# Patient Record
Sex: Male | Born: 1955 | Race: White | Hispanic: No | Smoking: Never smoker
Health system: Southern US, Community
[De-identification: ages and names within clinical notes are randomized; demographics above are authoritative.]

---

## 2015-11-02 ENCOUNTER — Ambulatory Visit (INDEPENDENT_AMBULATORY_CARE_PROVIDER_SITE_OTHER): Payer: PRIVATE HEALTH INSURANCE | Admitting: Sports Medicine

## 2015-11-02 VITALS — BP 141/74 | HR 61 | Resp 16 | Wt 223.7 lb

## 2015-11-02 DIAGNOSIS — M170 Bilateral primary osteoarthritis of knee: Secondary | ICD-10-CM

## 2015-11-02 DIAGNOSIS — Z96653 Presence of artificial knee joint, bilateral: Secondary | ICD-10-CM | POA: Insufficient documentation

## 2015-11-02 MED ORDER — ACETAMINOPHEN ER 650 MG PO TBCR: 1300.0000 mg | EXTENDED_RELEASE_TABLET | Freq: Three times a day (TID) | ORAL | Status: DC | PRN

## 2015-11-02 MED ORDER — TRAMADOL HCL 50 MG PO TABS: ORAL_TABLET | ORAL | Status: DC

## 2015-11-02 MED ORDER — MELOXICAM 15 MG PO TABS: ORAL_TABLET | ORAL | Status: DC

## 2015-11-02 NOTE — Progress Notes (Signed)
   Subjective:    I'm seeing this patient as a consultation for:  Dr. Johnney Killian  CC:  Bilateral knee pain  HPI: This is a pleasant 60 year old male with known bilateral knee osteoarthritis, x-ray confirmed, he has been seeing an orthopedic surgeon, most recent steroid injection was one month ago, takes ibuprofen and occasional arthritis strength Tylenol. Unfortunately continues to have pain bilaterally at the medial joint line, left worse than right. He has been looking into knee replacements in [facility], and needs MRIs prior, he is also agreeable to proceed with other nonoperative measures. No mechanical symptoms, he is post-knee arthroscopy on the right.  Past medical history, Surgical history, Family history not pertinant except as noted below, Social history, Allergies, and medications have been entered into the medical record, reviewed, and no changes needed.   Review of Systems: No headache, visual changes, nausea, vomiting, diarrhea, constipation, dizziness, abdominal pain, skin rash, fevers, chills, night sweats, weight loss, swollen lymph nodes, body aches, joint swelling, muscle aches, chest pain, shortness of breath, mood changes, visual or auditory hallucinations.   Objective:   General: Well Developed, well nourished, and in no acute distress.  Neuro/Psych: Alert and oriented x3, extra-ocular muscles intact, able to move all 4 extremities, sensation grossly intact. Skin: Warm and dry, no rashes noted.  Respiratory: Not using accessory muscles, speaking in full sentences, trachea midline.  Cardiovascular: Pulses palpable, no extremity edema. Abdomen: Does not appear distended. Bilateral knees: Minimal palpable effusion with a fluid wave and tenderness at the medial joint line bilaterally. ROM normal in flexion and extension and lower leg rotation. Ligaments with solid consistent endpoints including ACL, PCL, LCL, MCL. Negative Mcmurray's and provocative meniscal  tests. Non painful patellar compression. Patellar and quadriceps tendons unremarkable. Hamstring and quadriceps strength is normal.  Impression and Recommendations:   This case required medical decision making of moderate complexity.

## 2015-11-02 NOTE — Assessment & Plan Note (Signed)
Switching to meloxicam, continue arthritis strength Tylenol, adding tramadol for breakthrough pain. Return for custom orthotics. We will get Orthovisc approved, he just had a steroid injection one month ago. He is also getting set up for knee arthroplasties, needs bilateral MRIs, has already had x-rays.

## 2015-11-04 ENCOUNTER — Telehealth: Payer: Self-pay | Admitting: Sports Medicine

## 2015-11-04 ENCOUNTER — Ambulatory Visit (INDEPENDENT_AMBULATORY_CARE_PROVIDER_SITE_OTHER): Payer: PRIVATE HEALTH INSURANCE | Admitting: Sports Medicine

## 2015-11-04 VITALS — BP 114/65 | HR 71 | Resp 16 | Wt 223.0 lb

## 2015-11-04 DIAGNOSIS — M170 Bilateral primary osteoarthritis of knee: Secondary | ICD-10-CM

## 2015-11-04 NOTE — Telephone Encounter (Signed)
-----   Message from Monica Becton, MD sent at 11/02/2015 11:25 AM EST ----- Bilateral x-ray confirmed knee osteoarthritis, failed steroid injections, NSAIDs, needs bilateral Orthovisc approval. ___________________________________________ Ihor Austin. Benjamin Stain, M.D., ABFM., CAQSM. Primary Care and Sports Medicine [facility]  Adjunct Instructor of Family Medicine  [employer]

## 2015-11-04 NOTE — Progress Notes (Signed)

## 2015-11-04 NOTE — Assessment & Plan Note (Signed)
Doing well with meloxicam, tramadol, custom orthotics as above.

## 2015-11-04 NOTE — Telephone Encounter (Signed)
Submitted for approval on Orthovisc. Awaiting confirmation.  

## 2015-11-08 ENCOUNTER — Ambulatory Visit (INDEPENDENT_AMBULATORY_CARE_PROVIDER_SITE_OTHER): Payer: PRIVATE HEALTH INSURANCE

## 2015-11-08 DIAGNOSIS — S83241A Other tear of medial meniscus, current injury, right knee, initial encounter: Secondary | ICD-10-CM

## 2015-11-08 DIAGNOSIS — M170 Bilateral primary osteoarthritis of knee: Secondary | ICD-10-CM

## 2015-11-08 DIAGNOSIS — X58XXXA Exposure to other specified factors, initial encounter: Secondary | ICD-10-CM | POA: Diagnosis not present

## 2015-11-08 NOTE — Telephone Encounter (Signed)
Received the following information from [employer] benefits investigation:  PATIENT HAS PPO PLAN WITH AN EFFECTIVE DATE OF 04/08/2013. E5631 IS COVERED AT 60% & [id number] IS COVERED AT 100% OF THE CONTRACTED RATE WHEN PERFORMED IN AN OFFICE SETTING. *DEDUCTIBLE MUST BE MET FOR COVERAGE TO APPLY. IF OUT OF POCKET IS MET, COVERAGE GOES TO 100%. A COPAY OF $75.00 APPLIES WHETHER OR NOT A SPECIALIST OFFICE VISIT IS BILLED. REF# [id number].  Pt has been advised of the estimated OOP cost. He is going to think about it and then let us know.

## 2015-11-08 NOTE — Telephone Encounter (Signed)
Thanks, keep me posted.

## 2015-11-10 ENCOUNTER — Ambulatory Visit (INDEPENDENT_AMBULATORY_CARE_PROVIDER_SITE_OTHER): Payer: PRIVATE HEALTH INSURANCE | Admitting: Sports Medicine

## 2015-11-10 VITALS — BP 153/86 | HR 83 | Resp 18 | Wt 223.0 lb

## 2015-11-10 DIAGNOSIS — M170 Bilateral primary osteoarthritis of knee: Secondary | ICD-10-CM | POA: Diagnosis not present

## 2015-11-10 NOTE — Progress Notes (Signed)
  Subjective:    CC: follow-up  HPI: Knee arthritis: Known bilateral knee osteoarthritis, patient is here for follow-up of his MRI. MRI simply showed arthritis in all 3 compartments of both knees with medial meniscal tearing as expected. He continues to do well with Tylenol, meloxicam, and tramadol and is happy to call in for refills.  Past medical history, Surgical history, Family history not pertinant except as noted below, Social history, Allergies, and medications have been entered into the medical record, reviewed, and no changes needed.   Review of Systems: No fevers, chills, night sweats, weight loss, chest pain, or shortness of breath.   Objective:    General: Well Developed, well nourished, and in no acute distress.  Neuro: Alert and oriented x3, extra-ocular muscles intact, sensation grossly intact.  HEENT: Normocephalic, atraumatic, pupils equal round reactive to light, neck supple, no masses, no lymphadenopathy, thyroid nonpalpable.  Skin: Warm and dry, no rashes. Cardiac: Regular rate and rhythm, no murmurs rubs or gallops, no lower extremity edema.  Respiratory: Clear to auscultation bilaterally. Not using accessory muscles, speaking in full sentences.  Went over the MRI findings with the patient.  Impression and Recommendations:

## 2015-11-10 NOTE — Assessment & Plan Note (Signed)
Doing well with meloxicam, Tylenol, tramadol. MRIs per patient request showed triompartmental DJD with medial meniscal tearing as expected. Will call for refills. Last injection was about a month ago by another orthopedist. Return to see me on an as-needed basis, he was approved for Orthovisc but is responsible for his deductible first. He can return in 2 months if needed for a repeat steroid injection.

## 2015-12-08 ENCOUNTER — Other Ambulatory Visit: Payer: Self-pay | Admitting: Sports Medicine

## 2015-12-09 ENCOUNTER — Ambulatory Visit: Payer: PRIVATE HEALTH INSURANCE | Admitting: Sports Medicine

## 2015-12-23 ENCOUNTER — Ambulatory Visit (INDEPENDENT_AMBULATORY_CARE_PROVIDER_SITE_OTHER): Payer: PRIVATE HEALTH INSURANCE | Admitting: Sports Medicine

## 2015-12-23 VITALS — BP 158/82 | HR 60 | Wt 217.9 lb

## 2015-12-23 DIAGNOSIS — M170 Bilateral primary osteoarthritis of knee: Secondary | ICD-10-CM

## 2015-12-23 NOTE — Progress Notes (Signed)
  Subjective:    CC: bilateral knee pain  HPI: This is a pleasant 60 year old male, he has known primary osteoarthritis of both knees and desires repeat interventional treatment today, this will be the first injection we have done, pain is moderate, persistent, localized at the medial joint line of both knees without radiation.  Past medical history, Surgical history, Family history not pertinant except as noted below, Social history, Allergies, and medications have been entered into the medical record, reviewed, and no changes needed.   Review of Systems: No fevers, chills, night sweats, weight loss, chest pain, or shortness of breath.   Objective:    General: Well Developed, well nourished, and in no acute distress.  Neuro: Alert and oriented x3, extra-ocular muscles intact, sensation grossly intact.  HEENT: Normocephalic, atraumatic, pupils equal round reactive to light, neck supple, no masses, no lymphadenopathy, thyroid nonpalpable.  Skin: Warm and dry, no rashes. Cardiac: Regular rate and rhythm, no murmurs rubs or gallops, no lower extremity edema.  Respiratory: Clear to auscultation bilaterally. Not using accessory muscles, speaking in full sentences. Knees: Tender at the medial joint line bilaterally with an effusion in the right knee. ROM normal in flexion and extension and lower leg rotation. Ligaments with solid consistent endpoints including ACL, PCL, LCL, MCL. Negative Mcmurray's and provocative meniscal tests. Non painful patellar compression. Patellar and quadriceps tendons unremarkable. Hamstring and quadriceps strength is normal.  Procedure: Real-time Ultrasound Guided aspiration/Injection of right knee Device: GE Logiq E  Verbal informed consent obtained.  Time-out conducted.  Noted no overlying erythema, induration, or other signs of local infection.  Skin prepped in a sterile fashion.  Local anesthesia: Topical Ethyl chloride.  With sterile technique and under  real time ultrasound guidance:  30 mL straw-colored fluid aspirated, syringe switched and 1 mL kenalog 40, 2 mL lidocaine, 2 mL Marcaine injected easily Completed without difficulty  Pain immediately resolved suggesting accurate placement of the medication.  Advised to call if fevers/chills, erythema, induration, drainage, or persistent bleeding.  Images permanently stored and available for review in the ultrasound unit.  Impression: Technically successful ultrasound guided injection.  Procedure: Real-time Ultrasound Guided Injection of left knee Device: GE Logiq E  Verbal informed consent obtained.  Time-out conducted.  Noted no overlying erythema, induration, or other signs of local infection.  Skin prepped in a sterile fashion.  Local anesthesia: Topical Ethyl chloride.  With sterile technique and under real time ultrasound guidance:  1 mL kenalog 40, 2 mL lidocaine, 2 mL Marcaine injected easily Completed without difficulty  Pain immediately resolved suggesting accurate placement of the medication.  Advised to call if fevers/chills, erythema, induration, drainage, or persistent bleeding.  Images permanently stored and available for review in the ultrasound unit.  Impression: Technically successful ultrasound guided injection.  Impression and Recommendations:

## 2015-12-23 NOTE — Assessment & Plan Note (Signed)
Bilateral knee injection as above, return as needed. 

## 2015-12-23 NOTE — Addendum Note (Signed)
Addended by: Baird KayUGLAS, Yaelis Scharfenberg M on: 12/23/2015 04:10 PM   Modules accepted: Medications

## 2016-01-14 ENCOUNTER — Other Ambulatory Visit: Payer: Self-pay | Admitting: Sports Medicine

## 2016-02-08 ENCOUNTER — Ambulatory Visit (INDEPENDENT_AMBULATORY_CARE_PROVIDER_SITE_OTHER): Payer: PRIVATE HEALTH INSURANCE | Admitting: Sports Medicine

## 2016-02-08 DIAGNOSIS — M170 Bilateral primary osteoarthritis of knee: Secondary | ICD-10-CM

## 2016-02-08 MED ORDER — TRAMADOL HCL 50 MG PO TABS: ORAL_TABLET | ORAL | Status: DC

## 2016-02-08 MED ORDER — CELECOXIB 200 MG PO CAPS: ORAL_CAPSULE | ORAL | Status: DC

## 2016-02-08 NOTE — Progress Notes (Signed)
  Subjective:    CC: Knee swelling  HPI: This is a pleasant 60 year old male with known knee osteoarthritis, his last injection was only 6 weeks ago, he is having recurrence of pain and swelling, right worse than left, moderate, persistent, pain is worst at the medial joint lines, desires repeat interventional treatment for arthrocentesis today. No constitutional symptoms, no trauma, he has been walking more than usual. 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  Past medical history, Surgical history, Family history not pertinant except as noted below, Social history, Allergies, and medications have been entered into the medical record, reviewed, and no changes needed.   Review of Systems: No fevers, chills, night sweats, weight loss, chest pain, or shortness of breath.   Objective:    General: Well Developed, well nourished, and in no acute distress.  Neuro: Alert and oriented x3, extra-ocular muscles intact, sensation grossly intact.  HEENT: Normocephalic, atraumatic, pupils equal round reactive to light, neck supple, no masses, no lymphadenopathy, thyroid nonpalpable.  Skin: Warm and dry, no rashes. Cardiac: Regular rate and rhythm, no murmurs rubs or gallops, no lower extremity edema.  Respiratory: Clear to auscultation bilaterally. Not using accessory muscles, speaking in full sentences. Bilateral Knee: Visibly swollen with a palpable effusion and medial joint line tenderness. ROM normal in flexion and extension and lower leg rotation. Ligaments with solid consistent endpoints including ACL, PCL, LCL, MCL. Negative Mcmurray's and provocative meniscal tests. Non painful patellar compression. Patellar and quadriceps tendons unremarkable. Hamstring and quadriceps strength is normal.  Procedure: Real-time Ultrasound Guided aspiration of left knee  Device: GE Logiq E  Verbal informed consent obtained.  Time-out conducted.  Noted no overlying erythema, induration, or  other signs of local infection.  Skin prepped in a sterile fashion.  Local anesthesia: Topical Ethyl chloride.  With sterile technique and under real time ultrasound guidance:  10 mL aspirated from the suprapatellar recess Completed without difficulty  Pain immediately resolved suggesting accurate placement of the medication.  Advised to call if fevers/chills, erythema, induration, drainage, or persistent bleeding.  Images permanently stored and available for review in the ultrasound unit.  Impression: Technically successful ultrasound guided injection.  Procedure: Real-time Ultrasound Guided aspiration of right knee Device: GE Logiq E  Verbal informed consent obtained.  Time-out conducted.  Noted no overlying erythema, induration, or other signs of local infection.  Skin prepped in a sterile fashion.  Local anesthesia: Topical Ethyl chloride.  With sterile technique and under real time ultrasound guidance:  30 mL of straw-colored fluid aspirated from the suprapatellar recess. Completed without difficulty  Pain immediately resolved suggesting accurate placement of the medication.  Advised to call if fevers/chills, erythema, induration, drainage, or persistent bleeding.  Images permanently stored and available for review in the ultrasound unit.  Impression: Technically successful ultrasound guided injection.  Impression and Recommendations:

## 2016-02-08 NOTE — Assessment & Plan Note (Signed)
Switching from meloxicam to Celebrex and increasing tramadol to 6 pills per day.  aspiration of both knees today.  previous injection was only 6 weeks ago.  declines Visco

## 2016-03-03 ENCOUNTER — Other Ambulatory Visit: Payer: Self-pay | Admitting: Sports Medicine

## 2016-03-27 ENCOUNTER — Ambulatory Visit (INDEPENDENT_AMBULATORY_CARE_PROVIDER_SITE_OTHER): Payer: PRIVATE HEALTH INSURANCE | Admitting: Sports Medicine

## 2016-03-27 VITALS — BP 87/61 | HR 79 | Wt 200.0 lb

## 2016-03-27 DIAGNOSIS — M170 Bilateral primary osteoarthritis of knee: Secondary | ICD-10-CM

## 2016-03-27 MED ORDER — TRAMADOL HCL 50 MG PO TABS: ORAL_TABLET | ORAL | Status: DC

## 2016-03-27 NOTE — Progress Notes (Signed)
  Subjective:    CC: Follow-up  HPI: Bilateral knee osteoarthritis: 3 months since last injection, desires right knee aspiration and repeat bilateral injection, pain is moderate, persistent, localized at the joint lines without radiation.  Past medical history, Surgical history, Family history not pertinant except as noted below, Social history, Allergies, and medications have been entered into the medical record, reviewed, and no changes needed.   Review of Systems: No fevers, chills, night sweats, weight loss, chest pain, or shortness of breath.   Objective:    General: Well Developed, well nourished, and in no acute distress.  Neuro: Alert and oriented x3, extra-ocular muscles intact, sensation grossly intact.  HEENT: Normocephalic, atraumatic, pupils equal round reactive to light, neck supple, no masses, no lymphadenopathy, thyroid nonpalpable.  Skin: Warm and dry, no rashes. Cardiac: Regular rate and rhythm, no murmurs rubs or gallops, no lower extremity edema.  Respiratory: Clear to auscultation bilaterally. Not using accessory muscles, speaking in full sentences.  Procedure: Real-time Ultrasound Guided Injection of right knee Device: GE Logiq E  Verbal informed consent obtained.  Time-out conducted.  Noted no overlying erythema, induration, or other signs of local infection.  Skin prepped in a sterile fashion.  Local anesthesia: Topical Ethyl chloride.  With sterile technique and under real time ultrasound guidance:  Aspirated 18 mL straw-colored fluid, syringe switched and 1 mL Kenalog 40, 2 mL lidocaine, 2 mL Marcaine injected easily. Completed without difficulty  Pain immediately resolved suggesting accurate placement of the medication.  Advised to call if fevers/chills, erythema, induration, drainage, or persistent bleeding.  Images permanently stored and available for review in the ultrasound unit.  Impression: Technically successful ultrasound guided  injection.  Procedure: Real-time Ultrasound Guided Injection of left knee Device: GE Logiq E  Verbal informed consent obtained.  Time-out conducted.  Noted no overlying erythema, induration, or other signs of local infection.  Skin prepped in a sterile fashion.  Local anesthesia: Topical Ethyl chloride.  With sterile technique and under real time ultrasound guidance:  Using a 25-gauge needle advanced into the suprapatellar recess, 1 mL Kenalog 40, 2 mL lidocaine, 2 mL Marcaine injected easily. Completed without difficulty  Pain immediately resolved suggesting accurate placement of the medication.  Advised to call if fevers/chills, erythema, induration, drainage, or persistent bleeding.  Images permanently stored and available for review in the ultrasound unit.  Impression: Technically successful ultrasound guided injection.  Impression and Recommendations:

## 2016-03-27 NOTE — Assessment & Plan Note (Signed)
3 months since previous procedure. Aspiration of right knee with injection of both knees. Declined viscosupplementation at the last visit.

## 2016-03-28 ENCOUNTER — Ambulatory Visit: Payer: PRIVATE HEALTH INSURANCE | Admitting: Sports Medicine

## 2016-06-30 ENCOUNTER — Ambulatory Visit (INDEPENDENT_AMBULATORY_CARE_PROVIDER_SITE_OTHER): Payer: PRIVATE HEALTH INSURANCE | Admitting: Sports Medicine

## 2016-06-30 DIAGNOSIS — M170 Bilateral primary osteoarthritis of knee: Secondary | ICD-10-CM | POA: Diagnosis not present

## 2016-06-30 MED ORDER — TRAMADOL HCL ER 200 MG PO TB24: 200.0000 mg | ORAL_TABLET | Freq: Every day | ORAL | 0 refills | Status: DC

## 2016-06-30 NOTE — Assessment & Plan Note (Signed)
Previous injection was 3 months, repeat bilateral injection as above. Return as needed.

## 2016-06-30 NOTE — Progress Notes (Signed)
  Subjective:    CC: Follow-up  HPI: Bilateral knee osteoarthritis: Pain is moderate, persistent, previous injection was 3 months ago, desires repeat interventional treatment, pain is localized at the medial joint line bilaterally with swelling on the right side, no radiation. No trauma, no mechanical symptoms.  Past medical history:  Negative.  See flowsheet/record as well for more information.  Surgical history: Negative.  See flowsheet/record as well for more information.  Family history: Negative.  See flowsheet/record as well for more information.  Social history: Negative.  See flowsheet/record as well for more information.  Allergies, and medications have been entered into the medical record, reviewed, and no changes needed.   Review of Systems: No fevers, chills, night sweats, weight loss, chest pain, or shortness of breath.   Objective:    General: Well Developed, well nourished, and in no acute distress.  Neuro: Alert and oriented x3, extra-ocular muscles intact, sensation grossly intact.  HEENT: Normocephalic, atraumatic, pupils equal round reactive to light, neck supple, no masses, no lymphadenopathy, thyroid nonpalpable.  Skin: Warm and dry, no rashes. Cardiac: Regular rate and rhythm, no murmurs rubs or gallops, no lower extremity edema.  Respiratory: Clear to auscultation bilaterally. Not using accessory muscles, speaking in full sentences. Bilateral knees: Tender to palpation at the medial joint line bilaterally with a palpable fluid wave and effusion on the right side. ROM normal in flexion and extension and lower leg rotation. Ligaments with solid consistent endpoints including ACL, PCL, LCL, MCL. Negative Mcmurray's and provocative meniscal tests. Non painful patellar compression. Patellar and quadriceps tendons unremarkable. Hamstring and quadriceps strength is normal.  Procedure: Real-time Ultrasound Guided aspiration/injection of right knee Device: GE Logiq E    Verbal informed consent obtained.  Time-out conducted.  Noted no overlying erythema, induration, or other signs of local infection.  Skin prepped in a sterile fashion.  Local anesthesia: Topical Ethyl chloride.  With sterile technique and under real time ultrasound guidance:  Aspirated 28 mL straw-colored fluid, syringe switched and 1 mL kenalog 40, 2 mL lidocaine, 2 mL Marcaine injected easily Completed without difficulty  Pain immediately resolved suggesting accurate placement of the medication.  Advised to call if fevers/chills, erythema, induration, drainage, or persistent bleeding.  Images permanently stored and available for review in the ultrasound unit.  Impression: Technically successful ultrasound guided injection.  Procedure: Real-time Ultrasound Guided Injection of left knee Device: GE Logiq E  Verbal informed consent obtained.  Time-out conducted.  Noted no overlying erythema, induration, or other signs of local infection.  Skin prepped in a sterile fashion.  Local anesthesia: Topical Ethyl chloride.  With sterile technique and under real time ultrasound guidance:  1 mL kenalog 40, 2 mL lidocaine, 2 mL Marcaine injected easily. Completed without difficulty  Pain immediately resolved suggesting accurate placement of the medication.  Advised to call if fevers/chills, erythema, induration, drainage, or persistent bleeding.  Images permanently stored and available for review in the ultrasound unit.  Impression: Technically successful ultrasound guided injection.  Impression and Recommendations:    Primary osteoarthritis of both knees Previous injection was 3 months, repeat bilateral injection as above. Return as needed.  I spent 25 minutes with this patient, greater than 50% was face-to-face time counseling regarding the above diagnoses, this was separate from the time spent performing the procedure.

## 2016-08-01 ENCOUNTER — Other Ambulatory Visit: Payer: Self-pay | Admitting: Sports Medicine

## 2016-08-01 DIAGNOSIS — M170 Bilateral primary osteoarthritis of knee: Secondary | ICD-10-CM

## 2016-08-21 ENCOUNTER — Other Ambulatory Visit: Payer: Self-pay | Admitting: Sports Medicine

## 2016-08-21 DIAGNOSIS — M170 Bilateral primary osteoarthritis of knee: Secondary | ICD-10-CM

## 2016-09-02 ENCOUNTER — Other Ambulatory Visit: Payer: Self-pay | Admitting: Sports Medicine

## 2016-09-02 DIAGNOSIS — M170 Bilateral primary osteoarthritis of knee: Secondary | ICD-10-CM

## 2016-10-03 ENCOUNTER — Ambulatory Visit (INDEPENDENT_AMBULATORY_CARE_PROVIDER_SITE_OTHER): Payer: PRIVATE HEALTH INSURANCE | Admitting: Sports Medicine

## 2016-10-03 DIAGNOSIS — M170 Bilateral primary osteoarthritis of knee: Secondary | ICD-10-CM | POA: Diagnosis not present

## 2016-10-03 MED ORDER — TRAMADOL HCL ER 300 MG PO TB24: 300.0000 mg | ORAL_TABLET | Freq: Every day | ORAL | 0 refills | Status: DC

## 2016-10-03 MED ORDER — TRAMADOL HCL 50 MG PO TABS: ORAL_TABLET | ORAL | 1 refills | Status: DC

## 2016-10-03 NOTE — Progress Notes (Signed)
  Subjective:    CC: Bilateral knee pain  HPI: This is a pleasant 60 year old male with known bilateral knee osteoarthritis, we injected him 3 months ago and he did well, now having recurrence of pain, desires repeat interventional treatment today, pain is moderate, persistent, localized at the joint lines on both sides with mild swelling. No mechanical symptoms, no trauma, no constitutional symptoms.  Past medical history:  Negative.  See flowsheet/record as well for more information.  Surgical history: Negative.  See flowsheet/record as well for more information.  Family history: Negative.  See flowsheet/record as well for more information.  Social history: Negative.  See flowsheet/record as well for more information.  Allergies, and medications have been entered into the medical record, reviewed, and no changes needed.   Review of Systems: No fevers, chills, night sweats, weight loss, chest pain, or shortness of breath.   Objective:    General: Well Developed, well nourished, and in no acute distress.  Neuro: Alert and oriented x3, extra-ocular muscles intact, sensation grossly intact.  HEENT: Normocephalic, atraumatic, pupils equal round reactive to light, neck supple, no masses, no lymphadenopathy, thyroid nonpalpable.  Skin: Warm and dry, no rashes. Cardiac: Regular rate and rhythm, no murmurs rubs or gallops, no lower extremity edema.  Respiratory: Clear to auscultation bilaterally. Not using accessory muscles, speaking in full sentences. Bilateral knees: Only a mild effusion with tenderness at the medial joint lines laterally. ROM normal in flexion and extension and lower leg rotation. Ligaments with solid consistent endpoints including ACL, PCL, LCL, MCL. Negative Mcmurray's and provocative meniscal tests. Non painful patellar compression. Patellar and quadriceps tendons unremarkable. Hamstring and quadriceps strength is normal.  Procedure: Real-time Ultrasound Guided  Injection of left knee Device: GE Logiq E  Verbal informed consent obtained.  Time-out conducted.  Noted no overlying erythema, induration, or other signs of local infection.  Skin prepped in a sterile fashion.  Local anesthesia: Topical Ethyl chloride.  With sterile technique and under real time ultrasound guidance:  1 mL kenalog 40, 2 mL lidocaine, 2 mL Marcaine injected easily. Completed without difficulty  Pain immediately resolved suggesting accurate placement of the medication.  Advised to call if fevers/chills, erythema, induration, drainage, or persistent bleeding.  Images permanently stored and available for review in the ultrasound unit.  Impression: Technically successful ultrasound guided injection.  Procedure: Real-time Ultrasound Guided Injection of right knee Device: GE Logiq E  Verbal informed consent obtained.  Time-out conducted.  Noted no overlying erythema, induration, or other signs of local infection.  Skin prepped in a sterile fashion.  Local anesthesia: Topical Ethyl chloride.  With sterile technique and under real time ultrasound guidance:  1 mL kenalog 40, 2 mL lidocaine, 2 mL Marcaine injected easily. Completed without difficulty  Pain immediately resolved suggesting accurate placement of the medication.  Advised to call if fevers/chills, erythema, induration, drainage, or persistent bleeding.  Images permanently stored and available for review in the ultrasound unit.  Impression: Technically successful ultrasound guided injection.  Impression and Recommendations:    Primary osteoarthritis of both knees It's been 3 months since previous injection, repeat bilateral injections as above. Return to see me as needed.

## 2016-10-03 NOTE — Assessment & Plan Note (Addendum)
It's been 3 months since previous injection, repeat bilateral injections as above. Return to see me as needed.  He is considering arthroplasty on the right knee, [place], I have reminded him that pain medications will need to come from the orthopedic surgeon. I can take care of the postop x-rays as well as therapy.

## 2016-10-03 NOTE — Addendum Note (Signed)
Addended by: Monica BectonHEKKEKANDAM, Zasha Belleau J on: 10/03/2016 11:15 AM   Modules accepted: Orders

## 2016-11-27 ENCOUNTER — Telehealth: Payer: Self-pay | Admitting: Family Medicine

## 2016-11-27 DIAGNOSIS — S83241S Other tear of medial meniscus, current injury, right knee, sequela: Secondary | ICD-10-CM

## 2016-11-27 NOTE — Telephone Encounter (Signed)
Pt states he needs a new MRI . Thanks, [employer]

## 2016-11-27 NOTE — Telephone Encounter (Signed)
Of the knee?  If one of our surgical colleagues desires this then they will need to order it.

## 2016-11-28 NOTE — Telephone Encounter (Signed)
Pt os [place] and they will not accept an MRI over 6 months old. Please assist. Just needs MRI on right knee.

## 2016-11-28 NOTE — Telephone Encounter (Signed)
MRI ordered

## 2016-12-06 ENCOUNTER — Other Ambulatory Visit: Payer: Self-pay | Admitting: Sports Medicine

## 2016-12-06 DIAGNOSIS — M170 Bilateral primary osteoarthritis of knee: Secondary | ICD-10-CM

## 2016-12-11 ENCOUNTER — Ambulatory Visit (INDEPENDENT_AMBULATORY_CARE_PROVIDER_SITE_OTHER): Payer: PRIVATE HEALTH INSURANCE

## 2016-12-11 DIAGNOSIS — M1711 Unilateral primary osteoarthritis, right knee: Secondary | ICD-10-CM

## 2016-12-11 DIAGNOSIS — M25761 Osteophyte, right knee: Secondary | ICD-10-CM

## 2016-12-11 DIAGNOSIS — S83241S Other tear of medial meniscus, current injury, right knee, sequela: Secondary | ICD-10-CM

## 2016-12-25 ENCOUNTER — Ambulatory Visit (INDEPENDENT_AMBULATORY_CARE_PROVIDER_SITE_OTHER): Payer: PRIVATE HEALTH INSURANCE

## 2016-12-25 ENCOUNTER — Telehealth: Payer: Self-pay

## 2016-12-25 DIAGNOSIS — M25461 Effusion, right knee: Secondary | ICD-10-CM

## 2016-12-25 DIAGNOSIS — M1711 Unilateral primary osteoarthritis, right knee: Secondary | ICD-10-CM

## 2016-12-25 DIAGNOSIS — M1710 Unilateral primary osteoarthritis, unspecified knee: Secondary | ICD-10-CM

## 2016-12-25 NOTE — Telephone Encounter (Signed)
Left VM

## 2016-12-25 NOTE — Telephone Encounter (Signed)
Xr ordered

## 2016-12-25 NOTE — Telephone Encounter (Signed)
Pt left VM asking for a repeat x-ray of his Right knee for his surgery. Please advise.

## 2016-12-31 ENCOUNTER — Other Ambulatory Visit: Payer: Self-pay | Admitting: Sports Medicine

## 2017-01-03 ENCOUNTER — Other Ambulatory Visit: Payer: Self-pay | Admitting: Sports Medicine

## 2017-01-03 DIAGNOSIS — M170 Bilateral primary osteoarthritis of knee: Secondary | ICD-10-CM

## 2017-01-29 ENCOUNTER — Telehealth: Payer: Self-pay

## 2017-01-29 NOTE — Telephone Encounter (Signed)
His surgeon places that referral.

## 2017-01-29 NOTE — Telephone Encounter (Signed)
Yes I know, but since I had nothing to do with his knee replacement, the surgeon that did the replacement needs to put in the referral with specifics as to what he wants done.  I don't know what the surgeon would want done in PT.

## 2017-01-29 NOTE — Telephone Encounter (Signed)
PT needs a referral for physcial therapy for his knee replacement.

## 2017-01-29 NOTE — Telephone Encounter (Signed)
I really don't know.  His surgeon should write up some sort of regimen for him.  I don't do the surgeries so I don't really know the followup and PT schedule.

## 2017-01-29 NOTE — Telephone Encounter (Signed)
Pt recently had his knee replacement done out of state and would like to know when he should start scheduling f/u appointments. Please advise.

## 2017-01-29 NOTE — Telephone Encounter (Signed)
I spoke to Terry Greene he states that he spoke to you about this previously and his surgeon is in [place] so he doesn't think he can do a referral out of state. Please let me know how you want to proceed.

## 2017-01-30 NOTE — Telephone Encounter (Signed)
Pt.notified

## 2017-01-31 ENCOUNTER — Encounter: Payer: Self-pay | Admitting: Rehabilitative and Restorative Service Providers"

## 2017-01-31 ENCOUNTER — Ambulatory Visit (INDEPENDENT_AMBULATORY_CARE_PROVIDER_SITE_OTHER): Payer: PRIVATE HEALTH INSURANCE | Admitting: Rehabilitative and Restorative Service Providers"

## 2017-01-31 DIAGNOSIS — M25562 Pain in left knee: Secondary | ICD-10-CM

## 2017-01-31 DIAGNOSIS — R2689 Other abnormalities of gait and mobility: Secondary | ICD-10-CM | POA: Diagnosis not present

## 2017-01-31 DIAGNOSIS — M25662 Stiffness of left knee, not elsewhere classified: Secondary | ICD-10-CM | POA: Diagnosis not present

## 2017-01-31 DIAGNOSIS — M6281 Muscle weakness (generalized): Secondary | ICD-10-CM | POA: Diagnosis not present

## 2017-01-31 NOTE — Therapy (Signed)
[facility] [place] [address] [place], [place], [address] Phone: [phone]   Fax:  [phone]  Physical Therapy Evaluation  Patient Details  Name: Terry Greene MRN: [id number] Date of Birth: [birth year] Referring Provider: Dr Fayrene Fearing Mitchell/ Dr Benjamin Stain   Encounter Date: 01/31/2017      PT End of Session - 01/31/17 1026    Visit Number 1   Number of Visits 24   Date for PT Re-Evaluation 04/25/17   PT Start Time 0846   PT Stop Time 0954   PT Time Calculation (min) 68 min   Activity Tolerance Patient tolerated treatment well      History reviewed. No pertinent past medical history.  History reviewed. No pertinent surgical history.  There were no vitals filed for this visit.       Subjective Assessment - 01/31/17 0853    Subjective Patient reports gradual onset of Rt knee pain due to arthritis. Had a scope 2007. He treated pain conservatively with injections; meds; inserts. Underwent  Rt TKA 01/19/17 - staples are still in. Continues to have pain and limited movement.    Pertinent History Arthritis; HTN   How long can you sit comfortably? 10-15 min    How long can you stand comfortably? 5-10 min    How long can you walk comfortably? 5-10 min    Diagnostic tests xrays    Patient Stated Goals work on knee rehab lkike the doctor wants him to do   Currently in Pain? Yes   Pain Score 5    Pain Location Knee   Pain Orientation Right   Pain Descriptors / Indicators Aching;Nagging   Pain Type Surgical pain   Pain Onset 1 to 4 weeks ago   Pain Frequency Intermittent   Aggravating Factors  straightening the knee; bending knee; walking    Pain Relieving Factors changing positions; ice; meds             [facility] PT Assessment - 01/31/17 0001      Assessment   Medical Diagnosis Rt TKA   Referring Provider Dr Fayrene Fearing Mitchell/ Dr Benjamin Stain    Onset Date/Surgical Date 01/19/17   Hand Dominance Right   Next MD  Visit 4/37/18   Prior Therapy 2 days in hospital     Precautions   Precautions None     Restrictions   Weight Bearing Restrictions No     Balance Screen   Has the patient fallen in the past 6 months No   Has the patient had a decrease in activity level because of a fear of falling?  No   Is the patient reluctant to leave their home because of a fear of falling?  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; gym 2-3 day/wk machines and eliptical - no treadmill      Observation/Other Assessments   Focus on Therapeutic Outcomes (FOTO)  55% limitation      Sensation   Additional Comments WNL's per pt report      Posture/Postural Control   Posture Comments weight shifted to the Lt; trunk and hips flexed forward; Varus Lt  knee      AROM   Right/Left Hip --  tight hip rotation and extension bilat    Right Knee Extension -9   Right Knee Flexion 80   Left Knee Extension -4   Left Knee Flexion 104     Strength   Right Hip Flexion 4+/5   Right Hip Extension 4/5   Right Hip ABduction 4/5   Left Hip Flexion --  5-/5   Left Hip Extension --  5-/5   Left Hip ABduction 4+/5   Right Knee Flexion 4/5   Right Knee Extension 4/5   Left Knee Flexion 4+/5   Left Knee Extension 4+/5     Flexibility   Hamstrings tightn bilat Rt ~ 75 deg knee in some flexion;  Lt 75 deg    Quadriceps prone knee flexion Rt 72 deg; Lt104   ITB tight bilat     Palpation   Patella mobility limited patellar mobiioty bilat Rt > Lt                    [facility] Adult PT Treatment/Exercise - 01/31/17 0001      Knee/Hip Exercises: Stretches   Passive Hamstring Stretch 3  reps;30 seconds  supine w/strap   Quad Stretch 3 reps;30 seconds  prone with strap    ITB Stretch 3 reps;30 seconds  supine w/ strap    Other Knee/Hip Stretches heel slide with strap 20 sec hold x 8      Vasopneumatic   Number Minutes Vasopneumatic  15 minutes   Vasopnuematic Location  Knee   Vasopneumatic Pressure Low   Vasopneumatic Temperature  34 deg                PT Education - 01/31/17 0930    Education provided (P)  Yes   Education Details (P)  HEP    Person(s) Educated (P)  Patient   Methods (P)  Explanation;Demonstration;Tactile cues;Verbal cues;Handout   Comprehension (P)  Verbalized understanding;Returned demonstration;Verbal cues required;Tactile cues required          PT Short Term Goals - 01/31/17 1033      PT SHORT TERM GOAL #1   Title Increase AROM 50 (-)4 to 100 deg 03/14/17   Time 6   Period Weeks   Status New     PT SHORT TERM GOAL #2   Title Patient to ambulate with good gait pattern with SPC for functional distances of at least 10-15 min 03/14/17   Time 6   Period Weeks   Status New     PT SHORT TERM GOAL #3   Title Independent with initial HEP 03/14/17   Time 6   Period Days           PT Long Term Goals - 01/31/17 1038      PT LONG TERM GOAL #1   Title Increase AROM Rt knee to (-) 2 deg extension to 110 deg flexion 04/25/17   Time 12   Period Weeks   Status New     PT LONG TERM GOAL #2   Title 5/5/ strength Rt LE 04/25/17   Time 12   Period Weeks   Status New     PT LONG TERM GOAL #3   Title Ambulate without assistive device with good gait pattern for functional distances and at least 20-30 min  04/25/17   Time 12   Period Weeks   Status New     PT LONG TERM GOAL #4  Title Improve FOTO to </= 31% limitation 04/25/17   Time 12   Period Weeks   Status New     PT LONG TERM GOAL #5   Title Independent in HEP 04/25/17   Time 12   Period Weeks   Status New               Plan - 01/31/17 1027    Clinical  Impression Statement Terry Greene is seen for low complexity evaluation for Rt TKA 01/19/17. He has poor posture and alignment with Lt knee varus positioning creating leg length difference; decreased ROM and strength Rt LE; abnormal, antalgic gait with assistive device. Terry Greene has limited functional and work abilities following surgery. He will benefit form PT to address deficits and return to normal functional activity level.    Rehab Potential Good   PT Frequency 2x / week   PT Duration 12 weeks   PT Treatment/Interventions Patient/family education;ADLs/Self Care Home Management;Cryotherapy;Electrical Stimulation;Iontophoresis /ml Dexamethasone;Moist Heat;Ultrasound;Gait training;Dry needling;Manual techniques;Vasopneumatic Device;Therapeutic activities;Therapeutic exercise   PT Next Visit Plan progress with ROM, strengthening; gait training; address pain with modalities as indicated; manual work through areas of tightness with instruction in myofacial release as indicated    Consulted and Agree with Plan of Care Patient      Patient will benefit from skilled therapeutic intervention in order to improve the following deficits and impairments:  Postural dysfunction, Improper body mechanics, Pain, Decreased range of motion, Decreased mobility, Decreased strength, Abnormal gait, Decreased activity tolerance  Visit Diagnosis: Acute pain of left knee - Plan: PT plan of care cert/re-cert  Muscle weakness (generalized) - Plan: PT plan of care cert/re-cert  Other abnormalities of gait and mobility - Plan: PT plan of care cert/re-cert  Stiffness of left knee, not elsewhere classified - Plan: PT plan of care cert/re-cert     Problem List Patient Active Problem List   Diagnosis Date Noted  . Primary osteoarthritis of both knees 11/02/2015    Celyn Rober Minion PT, MPH  01/31/2017, 10:46 AM  [facility] [address] [place], [place],  [address] Phone: [phone]   Fax:  [phone]  Name: Terry Greene MRN: [id number] Date of Birth: 06-May-1956

## 2017-01-31 NOTE — Patient Instructions (Signed)
HIP: Hamstrings - Supine   Place strap around foot. Raise leg up, keeping knee straight.  Bend opposite knee to protect back if indicated. Hold 30 seconds. 3 reps per set, 2-3 sets per day   Outer Hip Stretch: Reclined IT Band Stretch (Strap)   Strap around one foot, pull leg across body until you feel a pull or stretch, with shoulders on mat. Hold for 30 seconds. Repeat 3 times each leg. 2-3 times/day.  Quads / HF, Prone   Lie face down. Grasp one ankle with same-side hand. Use towel if needed to reach. Gently pull foot toward buttock.  Hold 30 seconds. Repeat 3 times per session. Do 2-3 sessions per day.   Bracing With Heel Slides (Supine)    With neutral spine, tighten pelvic floor and abdominals and hold. Alternating legs, slide heel to bottom. Repeat _5-10__ times. Do _3-4__ times a day.

## 2017-02-05 ENCOUNTER — Ambulatory Visit (INDEPENDENT_AMBULATORY_CARE_PROVIDER_SITE_OTHER): Payer: PRIVATE HEALTH INSURANCE | Admitting: Rehabilitative and Restorative Service Providers"

## 2017-02-05 DIAGNOSIS — M25562 Pain in left knee: Secondary | ICD-10-CM | POA: Diagnosis not present

## 2017-02-05 DIAGNOSIS — R2689 Other abnormalities of gait and mobility: Secondary | ICD-10-CM | POA: Diagnosis not present

## 2017-02-05 DIAGNOSIS — M6281 Muscle weakness (generalized): Secondary | ICD-10-CM | POA: Diagnosis not present

## 2017-02-05 DIAGNOSIS — M25662 Stiffness of left knee, not elsewhere classified: Secondary | ICD-10-CM | POA: Diagnosis not present

## 2017-02-05 NOTE — Therapy (Signed)
[facility] [place] [address] [place], [place], [address] Phone: [phone]   Fax:  [phone]  Physical Therapy Treatment  Patient Details  Name: Terry Greene MRN: [id number] Date of Birth: May 14, 1956 Referring Provider: Dr Fayrene Fearing Mitchell/ Dr Benjamin Stain   Encounter Date: 02/05/2017      PT End of Session - 02/05/17 0939    Visit Number 2   Number of Visits 24   Date for PT Re-Evaluation 04/25/17   PT Start Time 0938   PT Stop Time 1033   PT Time Calculation (min) 55 min   Activity Tolerance Patient tolerated treatment well      No past medical history on file.  No past surgical history on file.  There were no vitals filed for this visit.      Subjective Assessment - 02/05/17 0941    Subjective Staples removed Friday. Incision feels better. Was out out and about more yesterday - some walking on unlevel surfaces, which was hard. Some increased soreness today. Working on his exercises at home. Using the walker first thing in the morning for a bit until the knee loosens up some.    Currently in Pain? Yes   Pain Score 3    Pain Location Knee   Pain Orientation Right   Pain Descriptors / Indicators Aching;Nagging   Pain Type Surgical pain   Pain Onset 1 to 4 weeks ago   Pain Frequency Intermittent                         [facility] Adult PT Treatment/Exercise - 02/05/17 0001      Knee/Hip Exercises: Stretches   Other Knee/Hip Stretches heel slide with strap 20 sec hold x 8      Knee/Hip Exercises: Aerobic   Nustep L4 x 6 min for ROM      Knee/Hip Exercises: Standing   Heel Raises Both;20 reps   Hip Flexion AROM;Right;Left;20 reps   Hip Flexion Limitations alternate touch; working on knee flexion bending forward into knee flexion 20 sec hold x 10   Hip Abduction AROM;Right;Left;2 sets;10 reps   Hip Extension AROM;Right;Left;2 sets;10 reps   SLS 20 sec x 3 each LE HH assist for balance      Knee/Hip Exercises: Seated   Heel Slides AAROM;10 reps   Heel Slides Limitations sitting foot planted scoot forward on table for knee flexion stretch - x 10      Electrical Stimulation   Electrical Stimulation Location Rt knee    Electrical Stimulation Action IFC   Electrical Stimulation Parameters to tolerance   Electrical Stimulation Goals Pain;Edema     Vasopneumatic   Number Minutes Vasopneumatic  15 minutes   Vasopnuematic Location  Knee   Vasopneumatic Pressure Low   Vasopneumatic Temperature  34 deg                PT Education - 02/05/17 1004    Education provided Yes   Education Details HEP TENS   Person(s) Educated Patient   Methods Explanation;Demonstration;Tactile cues;Verbal cues;Handout   Comprehension Verbalized understanding;Returned demonstration;Verbal cues required;Tactile cues required          PT Short Term Goals - 02/05/17 0941      PT SHORT TERM GOAL #1   Title Increase AROM 50 (-)4 to 100 deg 03/14/17   Time 6   Period Weeks   Status On-going     PT SHORT TERM GOAL #2   Title Patient  to ambulate with good gait pattern with [facility] for functional distances of at least 10-15 min 03/14/17   Time 6   Period Weeks   Status On-going     PT SHORT TERM GOAL #3   Title Independent with initial HEP 03/14/17   Time 6   Period Weeks   Status On-going           PT Long Term Goals - 02/05/17 0940      PT LONG TERM GOAL #1   Title Increase AROM Rt knee to (-) 2 deg extension to 110 deg flexion 04/25/17   Time 12   Period Weeks   Status On-going     PT LONG TERM GOAL #2   Title 5/5/ strength Rt LE 04/25/17   Time 12   Period Weeks   Status On-going     PT LONG TERM GOAL #3   Title Ambulate without assistive device with good gait pattern for functional distances and at least 20-30 min  04/25/17   Time 12   Period Weeks   Status On-going     PT LONG TERM GOAL #4   Title Improve FOTO to </= 31% limitation 04/25/17   Time 12   Period Weeks    Status On-going     PT LONG TERM GOAL #5   Title Independent in HEP 04/25/17   Time 12   Period Weeks   Status On-going               Plan - 02/05/17 0946    Clinical Impression Statement Working hard with exercises. Added exercises without difficulty. no goals accomplished. Only second visit. Trial of e-stim to assess possibile use of TENS unit for pain management.     Rehab Potential Good   PT Frequency 2x / week   PT Duration 12 weeks   PT Treatment/Interventions Patient/family education;ADLs/Self Care Home Management;Cryotherapy;Electrical Stimulation;Iontophoresis /ml Dexamethasone;Moist Heat;Ultrasound;Gait training;Dry needling;Manual techniques;Vasopneumatic Device;Therapeutic activities;Therapeutic exercise   PT Next Visit Plan progress with ROM, strengthening; gait training; address pain with modalities as indicated; manual work through areas of tightness with instruction in myofacial release as indicated. Assess response to trial of estim for pain management.    Consulted and Agree with Plan of Care Patient      Patient will benefit from skilled therapeutic intervention in order to improve the following deficits and impairments:  Postural dysfunction, Improper body mechanics, Pain, Decreased range of motion, Decreased mobility, Decreased strength, Abnormal gait, Decreased activity tolerance  Visit Diagnosis: Acute pain of left knee  Muscle weakness (generalized)  Other abnormalities of gait and mobility  Stiffness of left knee, not elsewhere classified     Problem List Patient Active Problem List   Diagnosis Date Noted  . Primary osteoarthritis of both knees 11/02/2015    Celyn Rober Minion PT, MPH  02/05/2017, 12:32 PM  [facility] [address] [place] [address] [place], [place], [address] Phone: [phone]   Fax:  [phone]  Name: Terry Greene MRN: [id number] Date of Birth: 03-02-1956

## 2017-02-05 NOTE — Patient Instructions (Addendum)
Balance: Unilateral    Attempt to balance on left leg, eyes open. Hold _20-30___ seconds. Repeat __3-5__ times per set. Do _2___ sessions per day. You can perform exercise with eyes closed.   Strengthening: Hip Abduction - Resisted    With tubing around right leg, other side toward anchor, extend leg out from side. Repeat __10__ times per set. Do _2-3___ sets per session. Do __2__ sessions per day.   Strengthening: Hip Extension - Resisted    With tubing around right ankle, face anchor and pull leg straight back. Repeat _10___ times per set. Do __2-3__ sets per session. Do __2__ sessions per day.    TENS UNIT: This is helpful for muscle pain and spasm.   Search and Purchase a TENS 7000 2nd edition at www.tenspros.com. It should be less than $30.     TENS unit instructions: Do not shower or bathe with the unit on Turn the unit off before removing electrodes or batteries If the electrodes lose stickiness add a drop of water to the electrodes after they are disconnected from the unit and place on plastic sheet. If you continued to have difficulty, call the TENS unit company to purchase more electrodes. Do not apply lotion on the skin area prior to use. Make sure the skin is clean and dry as this will help prolong the life of the electrodes. After use, always check skin for unusual red areas, rash or other skin difficulties. If there are any skin problems, does not apply electrodes to the same area. Never remove the electrodes from the unit by pulling the wires. Do not use the TENS unit or electrodes other than as directed. Do not change electrode placement without consultating your therapist or physician. Keep 2 fingers with between each electrode.

## 2017-02-08 ENCOUNTER — Ambulatory Visit (INDEPENDENT_AMBULATORY_CARE_PROVIDER_SITE_OTHER): Payer: PRIVATE HEALTH INSURANCE | Admitting: Rehabilitative and Restorative Service Providers"

## 2017-02-08 ENCOUNTER — Encounter: Payer: Self-pay | Admitting: Rehabilitative and Restorative Service Providers"

## 2017-02-08 DIAGNOSIS — M6281 Muscle weakness (generalized): Secondary | ICD-10-CM | POA: Diagnosis not present

## 2017-02-08 DIAGNOSIS — M25662 Stiffness of left knee, not elsewhere classified: Secondary | ICD-10-CM | POA: Diagnosis not present

## 2017-02-08 DIAGNOSIS — M25562 Pain in left knee: Secondary | ICD-10-CM

## 2017-02-08 DIAGNOSIS — R2689 Other abnormalities of gait and mobility: Secondary | ICD-10-CM

## 2017-02-08 NOTE — Therapy (Addendum)
[facility] [address] [place], [place], [address] Phone: [phone]   Fax:  [phone]  Physical Therapy Treatment  Patient Details  Name: Terry Greene MRN: [id number] Date of Birth: Jul 15, 1956 Referring Provider: Dr Fayrene Fearingjames Mitchell/Dr Benjamin Stainhekkekandam  Encounter Date: 02/08/2017      PT End of Session - 02/08/17 0933    Visit Number 3   Number of Visits 24   Date for PT Re-Evaluation 04/25/17   PT Start Time 0933   PT Stop Time 1030   PT Time Calculation (min) 57 min   Activity Tolerance Patient tolerated treatment well      History reviewed. No pertinent past medical history.  History reviewed. No pertinent surgical history.  There were no vitals filed for this visit.      Subjective Assessment - 02/08/17 0938    Subjective Continued stiffness and soreness. Liked the estim at last visit. may order one for home. Changes positions during the day and night.    Currently in Pain? Yes   Pain Score 4    Pain Location Knee   Pain Orientation Right   Pain Descriptors / Indicators Aching;Nagging   Pain Type Surgical pain   Pain Onset 1 to 4 weeks ago   Pain Frequency Intermittent            [facility] PT Assessment - 02/08/17 0001      Assessment   Medical Diagnosis Rt TKA   Referring Provider Dr Fayrene Fearingjames Mitchell/Dr Benjamin Stainhekkekandam   Onset Date/Surgical Date 01/19/17   Hand Dominance Right   Next MD Visit 4/37/18   Prior Therapy 2 days in hospital     AROM   Right Knee Flexion 90                     [facility] Adult PT Treatment/Exercise - 02/08/17 0001      Ambulation/Gait   Gait Comments worked on gait pattern focus on release for the Rt knee; worked on wt shift and equal stride length 80 ft x 3; backward walking x 40 ft x 6; side steps 40 ft x 6 toward each side      Knee/Hip Exercises: Dentisttretches   Quad Stretch 3 reps;30 seconds  prone with strap   Hip Flexor Stretch 5 reps;20 seconds  foot on  13 inch stool    Other Knee/Hip Stretches heel slide with strap 20 sec hold x 5      Knee/Hip Exercises: Aerobic   Nustep L4 x 6 min for ROM      Knee/Hip Exercises: Standing   Hip Flexion AROM;Right;Left;20 reps   Hip Flexion Limitations alternate touch; working on knee flexion bending forward into knee flexion 20 sec hold x 10     Knee/Hip Exercises: Seated   Heel Slides AAROM;10 reps   Heel Slides Limitations sitting foot planted scoot forward on table for knee flexion stretch - x 10      Electrical Stimulation   Electrical Stimulation Location Rt knee    Electrical Stimulation Action IFC   Electrical Stimulation Parameters to tolerance   Electrical Stimulation Goals Pain;Edema     Vasopneumatic   Number Minutes Vasopneumatic  15 minutes   Vasopnuematic Location  Knee   Vasopneumatic Pressure Low   Vasopneumatic Temperature  34 deg     provided patient with 3/8" heel lift Lt shoe to improve hip/pelvis/LE alignment following lengthening of Rt LE with correction of varus deformity with Rt TKA.  PT Short Term Goals - 02/05/17 0941      PT SHORT TERM GOAL #1   Title Increase AROM 50 (-)4 to 100 deg 03/14/17   Time 6   Period Weeks   Status On-going     PT SHORT TERM GOAL #2   Title Patient to ambulate with good gait pattern with SPC for functional distances of at least 10-15 min 03/14/17   Time 6   Period Weeks   Status On-going     PT SHORT TERM GOAL #3   Title Independent with initial HEP 03/14/17   Time 6   Period Weeks   Status On-going           PT Long Term Goals - 02/05/17 0940      PT LONG TERM GOAL #1   Title Increase AROM Rt knee to (-) 2 deg extension to 110 deg flexion 04/25/17   Time 12   Period Weeks   Status On-going     PT LONG TERM GOAL #2   Title 5/5/ strength Rt LE 04/25/17   Time 12   Period Weeks   Status On-going     PT LONG TERM GOAL #3   Title Ambulate without assistive device with good gait pattern for  functional distances and at least 20-30 min  04/25/17   Time 12   Period Weeks   Status On-going     PT LONG TERM GOAL #4   Title Improve FOTO to </= 31% limitation 04/25/17   Time 12   Period Weeks   Status On-going     PT LONG TERM GOAL #5   Title Independent in HEP 04/25/17   Time 12   Period Weeks   Status On-going               Plan - 02/08/17 0935    Clinical Impression Statement Working on exercises at home. Has progressed to gait with SPC and has doen soem walking without assistive device. Changiong positions at rest to combat stiffness. Progressing with rehab.    Rehab Potential Good   PT Frequency 2x / week   PT Duration 12 weeks   PT Treatment/Interventions Patient/family education;ADLs/Self Care Home Management;Cryotherapy;Electrical Stimulation;Iontophoresis 4mg /ml Dexamethasone;Moist Heat;Ultrasound;Gait training;Dry needling;Manual techniques;Vasopneumatic Device;Therapeutic activities;Therapeutic exercise   PT Next Visit Plan progress with ROM, strengthening; gait training; address pain with modalities as indicated; manual work through areas of tightness with instruction in myofacial release as indicated. Patient will consider estim for pain management.    Consulted and Agree with Plan of Care Patient      Patient will benefit from skilled therapeutic intervention in order to improve the following deficits and impairments:  Postural dysfunction, Improper body mechanics, Pain, Decreased range of motion, Decreased mobility, Decreased strength, Abnormal gait, Decreased activity tolerance  Visit Diagnosis: Acute pain of left knee  Muscle weakness (generalized)  Other abnormalities of gait and mobility  Stiffness of left knee, not elsewhere classified     Problem List Patient Active Problem List   Diagnosis Date Noted  . Primary osteoarthritis of both knees 11/02/2015    Terry Greene Rober Minion PT, MPH  02/08/2017, 10:19 AM  [facility] [address] [place], [place], [address] Phone: [phone]   Fax:  [phone]  Name: Terry Greene MRN: [id number] Date of Birth: 1956-04-25

## 2017-02-12 ENCOUNTER — Ambulatory Visit (INDEPENDENT_AMBULATORY_CARE_PROVIDER_SITE_OTHER): Payer: PRIVATE HEALTH INSURANCE | Admitting: Physical Therapy

## 2017-02-12 DIAGNOSIS — M25662 Stiffness of left knee, not elsewhere classified: Secondary | ICD-10-CM

## 2017-02-12 DIAGNOSIS — R2689 Other abnormalities of gait and mobility: Secondary | ICD-10-CM | POA: Diagnosis not present

## 2017-02-12 DIAGNOSIS — M25562 Pain in left knee: Secondary | ICD-10-CM | POA: Diagnosis not present

## 2017-02-12 DIAGNOSIS — M6281 Muscle weakness (generalized): Secondary | ICD-10-CM

## 2017-02-12 NOTE — Therapy (Signed)
[facility] [place] [address] [place], [place], [address] Phone: [phone]   Fax:  [phone]  Physical Therapy Treatment  Patient Details  Name: Terry Greene MRN: [id number] Date of Birth: September 11, 1956 Referring Provider: Dr. Fayrene Fearing Mitchell/Dr. Benjamin Stain  Encounter Date: 02/12/2017      PT End of Session - 02/12/17 0941    Visit Number 4   Number of Visits 24   Date for PT Re-Evaluation 04/25/17   PT Start Time 0936  pt arrived late   PT Stop Time 1032   PT Time Calculation (min) 56 min   Activity Tolerance Patient tolerated treatment well   Behavior During Therapy [facility] for tasks assessed/performed      No past medical history on file.  No past surgical history on file.  There were no vitals filed for this visit.      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  He feels he may have overdone it. He is still taking pain meds for relief.    Patient Stated Goals work on knee rehab lkike the doctor wants him to do   Currently in Pain? Yes   Pain Score 5             [facility] PT Assessment - 02/12/17 0001      Assessment   Medical Diagnosis Rt TKA   Referring Provider Dr. Fayrene Fearing Mitchell/Dr. Benjamin Stain   Onset Date/Surgical Date 01/19/17   Hand Dominance Right     AROM   Right Knee Extension -5   Right Knee Flexion 92          [facility] Adult PT Treatment/Exercise - 02/12/17 0001      Knee/Hip Exercises: Stretches   Passive Hamstring Stretch 3 reps;30 seconds  supine w/strap   Quad Stretch --   Gastroc Stretch Right;Left;2 reps;30 seconds   Soleus Stretch Right;Left;2 reps;30 seconds     Knee/Hip Exercises: Aerobic   Recumbent Bike semi-circles for ROM x 6 min      Knee/Hip Exercises: Standing   Forward Step Up Right;1 set;15 reps;Hand Hold: 2;Step Height: 6"   Step Down Left;1 set;15 reps;Hand Hold: 2  3" step   Gait Training 3 laps around gym (240 ft) without AD; VC for decreased Rt step length, increased knee flexion during swing through; quality slightly improving with increased distance.      Knee/Hip Exercises: Seated   Heel Slides Limitations sitting foot planted scoot forward on table for knee flexion stretch - x 10    Sit to Sand 10 reps     Knee/Hip Exercises: Prone   Hamstring Curl 1 set;10 reps   Prone Knee Hang 1 minute  2 reps     Electrical Stimulation   Electrical Stimulation Location Rt knee    Electrical Stimulation Action ion repelling   Electrical Stimulation Parameters to tolerance    Electrical Stimulation Goals Edema;Pain     Vasopneumatic   Number Minutes Vasopneumatic  15 minutes   Vasopnuematic Location  Knee   Vasopneumatic Pressure Low   Vasopneumatic Temperature  34 deg                PT Education - 02/12/17 1012    Education provided Yes   Education Details HEP    Person(s) Educated Patient   Methods Handout;Explanation;Demonstration;Verbal cues   Comprehension Verbalized understanding;Returned demonstration  PT Short Term Goals - 02/05/17 0941      PT SHORT TERM GOAL #1   Title Increase AROM 50 (-)4 to 100 deg 03/14/17   Time 6   Period Weeks   Status On-going     PT SHORT TERM GOAL #2   Title Patient to ambulate with good gait pattern with SPC for functional distances of at least 10-15 min 03/14/17   Time 6   Period Weeks   Status On-going     PT SHORT TERM GOAL #3   Title Independent with initial HEP 03/14/17   Time 6   Period Weeks   Status On-going           PT Long Term Goals - 02/12/17 1211      PT LONG TERM GOAL #1   Title Increase AROM Rt knee to (-) 2 deg extension to 110 deg flexion 04/25/17   Time 12   Period Weeks   Status On-going     PT LONG TERM GOAL #2   Title 5/5/ strength Rt LE 04/25/17   Time 12   Period Weeks   Status On-going     PT LONG TERM GOAL #3   Title Ambulate without assistive device  with good gait pattern for functional distances and at least 20-30 min  04/25/17   Time 12   Period Weeks   Status On-going     PT LONG TERM GOAL #4   Title Improve FOTO to </= 31% limitation 04/25/17   Time 12   Period Weeks   Status On-going     PT LONG TERM GOAL #5   Title Independent in HEP 04/25/17   Time 12   Period Weeks   Status On-going               Plan - 02/12/17 1022    Clinical Impression Statement Pt demonstrated slight gains in Rt knee ROM.  He is ambulating without AD at this time.  He tolerated all exercises with slight increase in pain (up 1-2 points).  Positive response to heel lift inserted into his shoe.    Rehab Potential Good   PT Frequency 2x / week   PT Duration 12 weeks   PT Treatment/Interventions Patient/family education;ADLs/Self Care Home Management;Cryotherapy;Electrical Stimulation;Iontophoresis 4mg /ml Dexamethasone;Moist Heat;Ultrasound;Gait training;Dry needling;Manual techniques;Vasopneumatic Device;Therapeutic activities;Therapeutic exercise   PT Next Visit Plan continue progressive Rt knee ROM, strengthening; gait training. Modalities as indicated.    Consulted and Agree with Plan of Care Patient      Patient will benefit from skilled therapeutic intervention in order to improve the following deficits and impairments:  Postural dysfunction, Improper body mechanics, Pain, Decreased range of motion, Decreased mobility, Decreased strength, Abnormal gait, Decreased activity tolerance  Visit Diagnosis: Acute pain of left knee  Muscle weakness (generalized)  Other abnormalities of gait and mobility  Stiffness of left knee, not elsewhere classified     Problem List Patient Active Problem List   Diagnosis Date Noted  . Primary osteoarthritis of both knees 11/02/2015   Mayer CamelJennifer Carlson-Long, PTA 02/12/17 12:17 PM  [facility] [address] [place], [place], [address] Phone:  [phone]   Fax:  [phone]  Name: Terry Greene MRN: [id number] Date of Birth: 20-Jul-1956

## 2017-02-12 NOTE — Patient Instructions (Signed)
Knee Extension Mobilization: Hang (Prone)    With table supporting thighs, place __0_ pound weight on right ankle. Hold __1-3__ minutes. Repeat _2___ times per set.     [facility] [address] [place], [place] [address]  [phone] (office) [phone] (fax)

## 2017-02-15 ENCOUNTER — Ambulatory Visit (INDEPENDENT_AMBULATORY_CARE_PROVIDER_SITE_OTHER): Payer: PRIVATE HEALTH INSURANCE | Admitting: Physical Therapy

## 2017-02-15 DIAGNOSIS — M25662 Stiffness of left knee, not elsewhere classified: Secondary | ICD-10-CM

## 2017-02-15 DIAGNOSIS — M25562 Pain in left knee: Secondary | ICD-10-CM | POA: Diagnosis not present

## 2017-02-15 DIAGNOSIS — M6281 Muscle weakness (generalized): Secondary | ICD-10-CM | POA: Diagnosis not present

## 2017-02-15 DIAGNOSIS — R2689 Other abnormalities of gait and mobility: Secondary | ICD-10-CM

## 2017-02-15 NOTE — Therapy (Signed)
Terry Greene [place], [place], [address] Phone: [phone]   Fax:  [phone]  Physical Therapy Treatment  Patient Details  Name: Terry Greene MRN: [id number] Date of Birth: 06/07/1956 Referring Provider: Dr. Jeneen Rinks Greene/ Dr. Dianah Greene  Encounter Date: 02/15/2017      PT End of Session - 02/15/17 0933    Visit Number 5   Number of Visits 24   Date for PT Re-Evaluation 04/25/17   PT Start Time 0930   PT Stop Time 1028   PT Time Calculation (min) 58 min      No past medical history on file.  No past surgical history on file.  There were no vitals filed for this visit.      Subjective Assessment - 02/15/17 0935    Subjective Pt reports he is still awaiting a refill on his pain medicine. Until then he is taking 1/2 tablets.  He feels the prone hang exercise is helping a lot.    Patient Stated Goals work on knee rehab lkike the doctor wants him to do   Currently in Pain? Yes   Pain Score 5    Pain Location Knee   Pain Orientation Right   Pain Descriptors / Indicators Aching   Aggravating Factors  prolonged positions, sleep positions   Pain Relieving Factors changing positions, ice, meds            [facility] PT Assessment - 02/15/17 0001      Assessment   Medical Diagnosis Rt TKA   Referring Provider Dr. Jeneen Rinks Greene/ Dr. Dianah Greene   Onset Date/Surgical Date 01/19/17   Hand Dominance Right   Next MD Visit not scheduled yet.      AROM   Right Knee Flexion 96         [facility] Adult PT Treatment/Exercise - 02/15/17 0001      Knee/Hip Exercises: Stretches   Hip Flexor Stretch 1 rep;60 seconds;Right;Left  standing, leg ext, pelvic tilt     Knee/Hip Exercises: Aerobic   Recumbent Bike semi-circles for ROM x 6 min      Knee/Hip Exercises: Standing   Lateral Step Up Right;1 set;10 reps;Hand Hold: 2;Step Height: 6"   Forward Step Up Right;1 set;Hand Hold: 2;Step Height: 6";10 reps   Step Down Left;1 set;15 reps;Hand Hold: 2  3" step   SLS Rt SLS x 15 sec x 3 reps (2 reps on LLE)   Gait Training 3 laps around gym (240 ft) without AD; VC for decreased Rt step length, increased knee flexion during swing through; quality slightly improving with increased distance.      Knee/Hip Exercises: Seated   Heel Slides Limitations seated scoots, 10 sec hold x 10 reps to increased Rt knee flexion ROM.     Knee/Hip Exercises: Prone   Prone Knee Hang 1 minute     Electrical Stimulation   Electrical Stimulation Location Rt knee    Electrical Stimulation Action ion repelling   Electrical Stimulation Parameters to tolerance    Electrical Stimulation Goals Edema;Pain     Vasopneumatic   Number Minutes Vasopneumatic  15 minutes   Vasopnuematic Location  Knee   Vasopneumatic Pressure Low   Vasopneumatic Temperature  34 deg            PT Short Term Goals - 02/15/17 1146      PT SHORT TERM GOAL #1   Title Increase AROM to (-)4 to 100 deg 03/14/17   Time 6   Period  Weeks   Status On-going     PT SHORT TERM GOAL #2   Title Patient to ambulate with good gait pattern with SPC for functional distances of at least 10-15 min 03/14/17   Time 6   Period Weeks   Status Achieved     PT SHORT TERM GOAL #3   Title Independent with initial HEP 03/14/17   Time 6   Period Weeks   Status Achieved           PT Long Term Goals - 02/15/17 1146      PT LONG TERM GOAL #1   Title Increase AROM Rt knee to (-) 2 deg extension to 110 deg flexion 04/25/17   Time 12   Period Weeks   Status On-going     PT LONG TERM GOAL #2   Title 5/5/ strength Rt LE 04/25/17   Time 12   Period Weeks   Status On-going     PT LONG TERM GOAL #3   Title Ambulate without assistive device with good gait pattern for functional distances and at least 20-30 min  04/25/17   Time 12   Period Weeks   Status On-going  progressing     PT LONG TERM GOAL #4   Title Improve FOTO to </= 31% limitation 04/25/17    Time 12   Period Weeks   Status On-going     PT LONG TERM GOAL #5   Title Independent in HEP 04/25/17   Time 12   Period Weeks   Status On-going               Plan - 02/15/17 1018    Clinical Impression Statement Pt gained 4 more degrees of Rt knee flexion ROM.  He had some mild increase in Rt knee pain with eccentric quad strengthening and ROM exercises.  Pain decreased with use of vaso/estim at end of session.  Pt has met STG #2 and 3.    Rehab Potential Good   PT Frequency 2x / week   PT Duration 12 weeks   PT Treatment/Interventions Patient/family education;ADLs/Self Care Home Management;Cryotherapy;Electrical Stimulation;Iontophoresis 68m/ml Dexamethasone;Moist Heat;Ultrasound;Gait training;Dry needling;Manual techniques;Vasopneumatic Device;Therapeutic activities;Therapeutic exercise   PT Next Visit Plan continue progressive Rt knee ROM, strengthening; gait training. Modalities as indicated.    Consulted and Agree with Plan of Care Patient      Patient will benefit from skilled therapeutic intervention in order to improve the following deficits and impairments:  Postural dysfunction, Improper body mechanics, Pain, Decreased range of motion, Decreased mobility, Decreased strength, Abnormal gait, Decreased activity tolerance  Visit Diagnosis: Acute pain of left knee  Muscle weakness (generalized)  Other abnormalities of gait and mobility  Stiffness of left knee, not elsewhere classified     Problem List Patient Active Problem List   Diagnosis Date Noted  . Primary osteoarthritis of both knees 11/02/2015   JKerin Greene PTA 02/15/17 11:52 AM  [facility] [address]Phone: [phone]  Fax:  [phone] Name: MBill YohnMRN: [id number] of Birth: [birth year]

## 2017-02-19 ENCOUNTER — Ambulatory Visit (INDEPENDENT_AMBULATORY_CARE_PROVIDER_SITE_OTHER): Payer: PRIVATE HEALTH INSURANCE | Admitting: Physical Therapy

## 2017-02-19 DIAGNOSIS — R2689 Other abnormalities of gait and mobility: Secondary | ICD-10-CM

## 2017-02-19 DIAGNOSIS — M6281 Muscle weakness (generalized): Secondary | ICD-10-CM | POA: Diagnosis not present

## 2017-02-19 DIAGNOSIS — M25562 Pain in left knee: Secondary | ICD-10-CM

## 2017-02-19 DIAGNOSIS — M25662 Stiffness of left knee, not elsewhere classified: Secondary | ICD-10-CM | POA: Diagnosis not present

## 2017-02-19 NOTE — Therapy (Signed)
[facility] [address] [place], [place], [address] Phone: [phone]   Fax:  [phone]  Physical Therapy Treatment  Patient Details  Name: Terry Greene MRN: [id number] Date of Birth: Jan 06, 1956 Referring Provider: Dr. Juliann Pulse; Dr. Benjamin Stain  Encounter Date: 02/19/2017      PT End of Session - 02/19/17 1015    Visit Number 6   Number of Visits 24   Date for PT Re-Evaluation 04/25/17   PT Start Time 0930   PT Stop Time 1030   PT Time Calculation (min) 60 min   Activity Tolerance Patient tolerated treatment well   Behavior During Therapy [facility] for tasks assessed/performed      No past medical history on file.  No past surgical history on file.  There were no vitals filed for this visit.      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 He has been working hard on his gait.    Currently in Pain? Yes   Pain Score 4    Pain Location Knee   Pain Orientation Right   Pain Descriptors / Indicators Aching   Aggravating Factors  prolonged positions, sleep positions   Pain Relieving Factors changing positions, ice, meds            [facility] PT Assessment - 02/19/17 0001      Assessment   Medical Diagnosis Rt TKA   Referring Provider Dr. Juliann Pulse; Dr. Benjamin Stain   Onset Date/Surgical Date 01/19/17   Hand Dominance Right   Next MD Visit not scheduled yet.    Prior Therapy 2 days in hospital     AROM   Right Knee Extension -3   Right Knee Flexion 98  with seated scoot         [facility] Adult PT Treatment/Exercise - 02/19/17 0001      Knee/Hip Exercises: Stretches   Passive Hamstring Stretch 3 reps;30 seconds  supine w/strap   Quad Stretch Right;3 reps;30 seconds   Hip Flexor Stretch 1 rep;60 seconds;Right;Left  standing, leg ext, pelvic tilt   Gastroc Stretch Right;Left;2 reps;30 seconds   Other Knee/Hip Stretches  standing Rt lunge on 13" step (for Rt knee ROM) hold 10 sec, x 5 reps, followed by Hamstring stretch x 5 reps     Knee/Hip Exercises: Aerobic   Elliptical 1 min trial; compensatory strategies. Pt encouraged to hold off on this equip until increased ROM.    Recumbent Bike semi-circles, very slow full circles (lifting hip) for ROM x 7 min      Knee/Hip Exercises: Standing   Lateral Step Up Right;1 set;10 reps;Hand Hold: 2;Step Height: 6"   Step Down Left;1 set;15 reps;Hand Hold: 2;Step Height: 6"   Wall Squat 1 set;10 reps   SLS Rt SLS x 30 sec x 2 reps with occasional UE support (2 reps on LLE)   Gait Training 4 laps around gym in between exercises to decrease stiffness.      Knee/Hip Exercises: Seated   Heel Slides Limitations seated scoots, 10 sec hold x 10 reps to increased Rt knee flexion ROM.     Biomedical engineer Parameters to tolerance    Electrical Stimulation Goals Edema;Pain     Vasopneumatic   Number Minutes Vasopneumatic  15 minutes   Vasopnuematic Location  Knee  Vasopneumatic Pressure Low   Vasopneumatic Temperature  34 deg                  PT Short Term Goals - 02/15/17 1146      PT SHORT TERM GOAL #1   Title Increase AROM to (-)4 to 100 deg 03/14/17   Time 6   Period Weeks   Status On-going     PT SHORT TERM GOAL #2   Title Patient to ambulate with good gait pattern with SPC for functional distances of at least 10-15 min 03/14/17   Time 6   Period Weeks   Status Achieved     PT SHORT TERM GOAL #3   Title Independent with initial HEP 03/14/17   Time 6   Period Weeks   Status Achieved           PT Long Term Goals - 02/15/17 1146      PT LONG TERM GOAL #1   Title Increase AROM Rt knee to (-) 2 deg extension to 110 deg flexion 04/25/17   Time 12   Period Weeks   Status On-going     PT LONG TERM GOAL #2   Title 5/5/  strength Rt LE 04/25/17   Time 12   Period Weeks   Status On-going     PT LONG TERM GOAL #3   Title Ambulate without assistive device with good gait pattern for functional distances and at least 20-30 min  04/25/17   Time 12   Period Weeks   Status On-going  progressing     PT LONG TERM GOAL #4   Title Improve FOTO to </= 31% limitation 04/25/17   Time 12   Period Weeks   Status On-going     PT LONG TERM GOAL #5   Title Independent in HEP 04/25/17   Time 12   Period Weeks   Status On-going               Plan - 02/19/17 1314    Clinical Impression Statement Pt making gradual gains towards goals.  Small improvement in Rt knee flexion ROM. Gait observed between exercises; much more fluid and less antalgic.  Pt tolerated all exercises with mild increase in pain; decreased with use of vaso/estim at end of session.     Rehab Potential Good   PT Frequency 2x / week   PT Duration 12 weeks   PT Treatment/Interventions Patient/family education;ADLs/Self Care Home Management;Cryotherapy;Electrical Stimulation;Iontophoresis 4mg /ml Dexamethasone;Moist Heat;Ultrasound;Gait training;Dry needling;Manual techniques;Vasopneumatic Device;Therapeutic activities;Therapeutic exercise   PT Next Visit Plan continue progressive Rt knee ROM, strengthening; gait training. Modalities as indicated.    Consulted and Agree with Plan of Care Patient      Patient will benefit from skilled therapeutic intervention in order to improve the following deficits and impairments:  Postural dysfunction, Improper body mechanics, Pain, Decreased range of motion, Decreased mobility, Decreased strength, Abnormal gait, Decreased activity tolerance  Visit Diagnosis: Acute pain of left knee  Muscle weakness (generalized)  Other abnormalities of gait and mobility  Stiffness of left knee, not elsewhere classified     Problem List Patient Active Problem List   Diagnosis Date Noted  . Primary osteoarthritis of  both knees 11/02/2015   Mayer CamelJennifer Carlson-Long, PTA 02/19/17 1:19 PM  [facility] [address] [place], [place], [address] Phone: [phone]   Fax:  [phone]  Name: Terry Greene MRN: [id number] Date of Birth: 11/16/1955

## 2017-02-22 ENCOUNTER — Ambulatory Visit (INDEPENDENT_AMBULATORY_CARE_PROVIDER_SITE_OTHER): Payer: PRIVATE HEALTH INSURANCE | Admitting: Physical Therapy

## 2017-02-22 DIAGNOSIS — M25662 Stiffness of left knee, not elsewhere classified: Secondary | ICD-10-CM

## 2017-02-22 DIAGNOSIS — M6281 Muscle weakness (generalized): Secondary | ICD-10-CM | POA: Diagnosis not present

## 2017-02-22 DIAGNOSIS — M25562 Pain in left knee: Secondary | ICD-10-CM | POA: Diagnosis not present

## 2017-02-22 DIAGNOSIS — R2689 Other abnormalities of gait and mobility: Secondary | ICD-10-CM

## 2017-02-22 NOTE — Therapy (Signed)
[facility] [address] [place], [place], [address] Phone: [phone]   Fax:  [phone]  Physical Therapy Treatment  Patient Details  Name: Terry CellaMichael Sharrow MRN: [id number] Date of Birth: Feb 18, 1956 Referring Provider: Dr. Juliann PulseJames Mitchell, Dr. Benjamin Stainhekkekandam  Encounter Date: 02/22/2017      PT End of Session - 02/22/17 1035    Visit Number 7   Number of Visits 24   Date for PT Re-Evaluation 04/25/17   PT Start Time 0931   PT Stop Time 1035   PT Time Calculation (min) 64 min   Activity Tolerance Patient tolerated treatment well   Behavior [facility] for tasks assessed/performed      No past medical history on file.  No past surgical history on file.  There were no vitals filed for this visit.      Subjective Assessment - 02/22/17 0938    Subjective Kathlene NovemberMike reports he has had a really hard time getting comfortable to sleep at night; waking freq due to pain.  Received pain med refill yesterday.    Patient Stated Goals work on knee rehab like the doctor wants him to do   Currently in Pain? Yes   Pain Score 6    Pain Location Knee   Pain Orientation Right   Pain Descriptors / Indicators Aching;Tightness;Sharp   Aggravating Factors  sleep position, being still too long   Pain Relieving Factors changing positions, ice, meds.             [facility] - 02/22/17 0001      Assessment   Medical Diagnosis Rt TKA   Referring Provider Dr. Juliann PulseJames Mitchell, Dr. Benjamin Stainhekkekandam   Onset Date/Surgical Date 01/19/17   Hand Dominance Right   Next MD Visit not scheduled yet.      AROM   Right Knee Extension -1   Right Knee Flexion 100  in seated scoot     Strength   Right Hip Flexion 5/5   Right Hip ABduction --  5-/5     Flexibility   Quadriceps Rt knee ~94 deg flexion          [facility] Adult PT Treatment/Exercise - 02/22/17 0001      Knee/Hip Exercises: Stretches   Passive Hamstring Stretch Right;3  reps;30 seconds   Quad Stretch Right;3 reps;30 seconds   Quad Stretch Limitations towel under knee   Gastroc Stretch Right;Left;2 reps;30 seconds     Knee/Hip Exercises: Aerobic   Tread Mill retro gait @ 1.0 mph    Recumbent Bike slow full circles for ROM x 9 min (unable to turn bike on, some lifting of Rt hip)      Knee/Hip Exercises: Standing   SLS Rt SLS with ball throw to rebounder x 25 throws.    Gait Training 4 laps around gym in between exercises to decrease stiffness.      Knee/Hip Exercises: Seated   Heel Slides Limitations seated scoots, 10 sec hold x 10 reps to increased Rt knee flexion ROM.     Knee/Hip Exercises: Prone   Hamstring Curl 2 sets;10 reps  after prone hang, 2nd set with 2.5#   Prone Knee Hang 1 minute  2 reps, 2nd rep with 2.5#     Electrical Stimulation   Electrical Stimulation Location Rt knee    Electrical Stimulation Action ion repelling   Electrical Stimulation Parameters to tolerance    Electrical Stimulation Goals Edema;Pain     Vasopneumatic   Number Minutes Vasopneumatic  15 minutes   Vasopnuematic Location  Knee   Vasopneumatic Pressure Low   Vasopneumatic Temperature  34 deg     Manual Therapy   Manual Therapy Soft tissue mobilization;Passive ROM   Soft tissue mobilization Edge tool assistance to Rt quad, knee joint line to decrease fascial restrictions, improve Rt knee ROM.  Muscle stripping to Rt quad distal to proximal   decreased tolerance at medial distal quad at knee   Passive ROM overpressure into ext of Rt knee                  PT Short Term Goals - 02/15/17 1146      PT SHORT TERM GOAL #1   Title Increase AROM to (-)4 to 100 deg 03/14/17   Time 6   Period Weeks   Status On-going     PT SHORT TERM GOAL #2   Title Patient to ambulate with good gait pattern with SPC for functional distances of at least 10-15 min 03/14/17   Time 6   Period Weeks   Status Achieved     PT SHORT TERM GOAL #3   Title Independent with  initial HEP 03/14/17   Time 6   Period Weeks   Status Achieved           PT Long Term Goals - 02/15/17 1146      PT LONG TERM GOAL #1   Title Increase AROM Rt knee to (-) 2 deg extension to 110 deg flexion 04/25/17   Time 12   Period Weeks   Status On-going     PT LONG TERM GOAL #2   Title 5/5/ strength Rt LE 04/25/17   Time 12   Period Weeks   Status On-going     PT LONG TERM GOAL #3   Title Ambulate without assistive device with good gait pattern for functional distances and at least 20-30 min  04/25/17   Time 12   Period Weeks   Status On-going  progressing     PT LONG TERM GOAL #4   Title Improve FOTO to </= 31% limitation 04/25/17   Time 12   Period Weeks   Status On-going     PT LONG TERM GOAL #5   Title Independent in HEP 04/25/17   Time 12   Period Weeks   Status On-going               Plan - 02/22/17 1315    Clinical Impression Statement Pt demonstrated slight increase in Rt knee ROM.  He was very sensitive in the musculature/tissue surrounding Rt medial knee.  Pt tolerated all exercises well, with mild increase in pain.  Pain reduced with use of vaso/estim.     Rehab Potential Good   PT Frequency 2x / week   PT Duration 12 weeks   PT Treatment/Interventions Patient/family education;ADLs/Self Care Home Management;Cryotherapy;Electrical Stimulation;Iontophoresis 4mg /ml Dexamethasone;Moist Heat;Ultrasound;Gait training;Dry needling;Manual techniques;Vasopneumatic Device;Therapeutic activities;Therapeutic exercise   PT Next Visit Plan continue progressive Rt knee ROM, strengthening; gait training. Modalities as indicated.    Consulted and Agree with Plan of Care Patient      Patient will benefit from skilled therapeutic intervention in order to improve the following deficits and impairments:  Postural dysfunction, Improper body mechanics, Pain, Decreased range of motion, Decreased mobility, Decreased strength, Abnormal gait, Decreased activity  tolerance  Visit Diagnosis: Acute pain of left knee  Muscle weakness (generalized)  Stiffness of left knee, not elsewhere classified  Other abnormalities of gait and mobility  Problem List Patient Active Problem List   Diagnosis Date Noted  . Primary osteoarthritis of both knees 11/02/2015   Mayer Camel, PTA 02/22/17 1:20 PM  [facility] [address] [place], [place], [address] Phone: [phone]   Fax:  [phone]  Name: Adeel Guiffre MRN: [id number] Date of Birth: 05-01-1956

## 2017-02-26 ENCOUNTER — Ambulatory Visit (INDEPENDENT_AMBULATORY_CARE_PROVIDER_SITE_OTHER): Payer: PRIVATE HEALTH INSURANCE | Admitting: Physical Therapy

## 2017-02-26 DIAGNOSIS — M6281 Muscle weakness (generalized): Secondary | ICD-10-CM

## 2017-02-26 DIAGNOSIS — M25662 Stiffness of left knee, not elsewhere classified: Secondary | ICD-10-CM

## 2017-02-26 DIAGNOSIS — R2689 Other abnormalities of gait and mobility: Secondary | ICD-10-CM | POA: Diagnosis not present

## 2017-02-26 DIAGNOSIS — M25562 Pain in left knee: Secondary | ICD-10-CM | POA: Diagnosis not present

## 2017-02-26 NOTE — Therapy (Signed)
[place], [place], [address] Phone: [phone]   Fax:  [phone]  Physical Therapy Treatment  Patient Details  Name: Terry Greene MRN: [id number] Date of Birth: 1956-06-04 Referring Provider: Dr. Minda Meo, Dr. [name]  Encounter Date: 02/26/2017      PT End of Session - 02/26/17 0938    Visit Number 8   Number of Visits 24   Date for PT Re-Evaluation 04/25/17   PT Start Time 0936  Pt arrived late   PT Stop Time 1033   PT Time Calculation (min) 57 min   Activity Tolerance Patient tolerated treatment well      No past medical history on file.  No past surgical history on file.  There were no vitals filed for this visit.      Subjective Assessment - 02/26/17 0939    Subjective Pt reports he has been experiencing "electrical impulse" zapping feeling in Rt knee lately; very sporadic, doesn't last. Otherwise, nothing new to report.    Patient Stated Goals work on knee rehab like the doctor wants him to do   Currently in Pain? Yes   Pain Score 5    Pain Location Knee   Pain Orientation Right   Pain Descriptors / Indicators Aching;Dull   Aggravating Factors  sleep position, being still too long    Pain Relieving Factors changing positions, ice, medication             [facility] PT Assessment - 02/26/17 0001      Assessment   Medical Diagnosis Rt TKA   Onset Date/Surgical Date 01/19/17   Hand Dominance Right   Next MD Visit not scheduled yet.      AROM   Right Knee Extension -2   Right Knee Flexion 104  during seated scoot          [facility] Adult PT Treatment/Exercise - 02/26/17 0001      Knee/Hip Exercises: Stretches   Gastroc Stretch Right;Left;2 reps;30 seconds     Knee/Hip Exercises: Aerobic   Recumbent Bike slow full circles for ROM x 7 min (unable to turn bike on, some lifting of Rt hip)      Knee/Hip Exercises: Standing   Lateral Step Up Right;1 set;10  reps;Step Height: 6"   Wall Squat 1 set;10 reps;5 seconds   Wall Squat Limitations VC for even weight   SLS Rt SLS with ball throw to rebounder x 25 throws; repeated with vectors (10/ 2 o'clock)     4 laps around gym in between exercises to decrease stiffness.    Other Standing Knee Exercises Retro gait, slow with focus on TKE - 100 ft      Knee/Hip Exercises: Seated   Heel Slides Limitations seated scoots, 10 sec hold x 10 reps to increased Rt knee flexion ROM.     Knee/Hip Exercises: Supine   Quad Sets Right;1 set;10 reps  heel propped     Knee/Hip Exercises: Prone   Hamstring Curl 2 sets;10 reps  3# at ankle   Prone Knee Hang 1 minute  2 reps, 2nd rep with 3#     Electrical Stimulation   Electrical Stimulation Location Rt knee    Electrical Stimulation Action ion repelling    Electrical Stimulation Parameters to tolerance    Electrical Stimulation Goals Edema;Pain     Vasopneumatic   Number Minutes Vasopneumatic  15 minutes   Vasopnuematic Location  Knee   Vasopneumatic Pressure Low   Vasopneumatic Temperature  34 deg     Manual Therapy   Manual Therapy Taping   Manual therapy comments I strip of Rock tape placed over incision on Rt knee to assist with scar management (25% stretch, zigzag pattern)   Passive ROM overpressure into ext of Rt knee           PT Short Term Goals - 02/26/17 1037      PT SHORT TERM GOAL #1   Title Increase AROM to (-)4 to 100 deg 03/14/17   Time 6   Period Weeks   Status Achieved     PT SHORT TERM GOAL #2   Title Patient to ambulate with good gait pattern with SPC for functional distances of at least 10-15 min 03/14/17   Time 6   Period Weeks     PT SHORT TERM GOAL #3   Title Independent with initial HEP 03/14/17   Time 6   Period Weeks   Status Achieved           PT Long Term Goals - 02/26/17 1032      PT LONG TERM GOAL #1   Title Increase AROM Rt knee to (-) 2 deg extension to 110 deg flexion 04/25/17   Time 12   Period  Weeks   Status Partially Met     PT LONG TERM GOAL #2   Title 5/5/ strength Rt LE 04/25/17   Time 12   Period Weeks   Status On-going     PT LONG TERM GOAL #3   Title Ambulate without assistive device with good gait pattern for functional distances and at least 20-30 min  04/25/17   Time 12   Period Weeks   Status Achieved     PT LONG TERM GOAL #4   Title Improve FOTO to </= 31% limitation 04/25/17   Time 12   Period Weeks   Status On-going     PT LONG TERM GOAL #5   Title Independent in HEP 04/25/17   Time 12   Period Weeks   Status On-going               Plan - 02/26/17 1033    Clinical Impression Statement Pt demonstrated slight improvement in Rt knee flexion ROM; has partially met LTG#1.   He tolerated all exercises today with minimal increase in pain. He has met LTG#3 and is progressing towards established goals.    Rehab Potential Good   PT Frequency 2x / week   PT Duration 12 weeks   PT Treatment/Interventions Patient/family education;ADLs/Self Care Home Management;Cryotherapy;Electrical Stimulation;Iontophoresis 44m/ml Dexamethasone;Moist Heat;Ultrasound;Gait training;Dry needling;Manual techniques;Vasopneumatic Device;Therapeutic activities;Therapeutic exercise   PT Next Visit Plan continue progressive Rt knee ROM, strengthening; gait training. Modalities as indicated.    Consulted and Agree with Plan of Care Patient      Patient will benefit from skilled therapeutic intervention in order to improve the following deficits and impairments:  Postural dysfunction, Improper body mechanics, Pain, Decreased range of motion, Decreased mobility, Decreased strength, Abnormal gait, Decreased activity tolerance  Visit Diagnosis: Acute pain of left knee  Muscle weakness (generalized)  Stiffness of left knee, not elsewhere classified  Other abnormalities of gait and mobility     Problem List Patient Active Problem List   Diagnosis Date Noted  . Primary  osteoarthritis of both knees 11/02/2015   JKerin Perna PTA 02/26/17 10:40 AM  [facility] [facility] [place] [address]Phone: [phone]  Fax:  [phone]  Name: Terry Greene MRN: [id number] Date of Birth: January 18, 1956

## 2017-03-01 ENCOUNTER — Ambulatory Visit (INDEPENDENT_AMBULATORY_CARE_PROVIDER_SITE_OTHER): Payer: PRIVATE HEALTH INSURANCE | Admitting: Physical Therapy

## 2017-03-01 DIAGNOSIS — M25662 Stiffness of left knee, not elsewhere classified: Secondary | ICD-10-CM | POA: Diagnosis not present

## 2017-03-01 DIAGNOSIS — M25562 Pain in left knee: Secondary | ICD-10-CM

## 2017-03-01 DIAGNOSIS — R2689 Other abnormalities of gait and mobility: Secondary | ICD-10-CM | POA: Diagnosis not present

## 2017-03-01 DIAGNOSIS — M6281 Muscle weakness (generalized): Secondary | ICD-10-CM | POA: Diagnosis not present

## 2017-03-01 NOTE — Therapy (Signed)
[place], [place], [address] Phone: [phone]   Fax:  [phone]  Physical Therapy Treatment  Patient Details  Name: Terry Greene MRN: [id number] Date of Birth: 11/22/1955 Referring Provider: Dr. Minda Meo, Dr. [name]  Encounter Date: 03/01/2017      PT End of Session - 03/01/17 1020    Visit Number 9   Number of Visits 24   Date for PT Re-Evaluation 04/25/17   PT Start Time 0934   PT Stop Time 1031   PT Time Calculation (min) 57 min   Activity Tolerance Patient tolerated treatment well   Behavior During Therapy [facility] for tasks assessed/performed      No past medical history on file.  No past surgical history on file.  There were no vitals filed for this visit.          [facility] PT Assessment - 03/01/17 0001      Assessment   Medical Diagnosis Rt TKA     AROM   Right Knee Flexion 105         [facility] Adult PT Treatment/Exercise - 03/01/17 0001      Knee/Hip Exercises: Aerobic   Recumbent Bike slow full circles for ROM x 7 min (unable to turn bike on, some lifting of Rt hip)      Knee/Hip Exercises: Standing   Side Lunges 1 set;10 reps;Right;Left   Lateral Step Up Right;1 set;10 reps;Hand Hold: 1;Step Height: 6"   Step Down Left;1 set;15 reps;Hand Hold: 2;Step Height: 6"   SLS Rt SLS forward leans to chair x 10 reps, repeated on Lt      Knee/Hip Exercises: Seated   Heel Slides Limitations seated scoots, 10 sec hold x 6 reps to increased Rt knee flexion ROM.  pt paused in between reps to massage knee     Knee/Hip Exercises: Supine   Quad Sets Right;1 set;5 reps  heel propped   Heel Slides Strengthening;Right;1 set;5 reps     Electrical Stimulation   Electrical Stimulation Location Rt knee    Electrical Stimulation Action ion repelling   Electrical Stimulation Parameters to tolerance    Electrical Stimulation Goals Edema;Pain     Vasopneumatic   Number  Minutes Vasopneumatic  15 minutes   Vasopnuematic Location  Knee  Rt   Vasopneumatic Pressure Low   Vasopneumatic Temperature  34 deg                  PT Short Term Goals - 03/01/17 1134      PT SHORT TERM GOAL #1   Title Increase AROM to (-)4 to 100 deg 03/14/17   Time 6   Period Weeks   Status Achieved     PT SHORT TERM GOAL #2   Title Patient to ambulate with good gait pattern with SPC for functional distances of at least 10-15 min 03/14/17   Time 6   Period Weeks   Status Achieved     PT SHORT TERM GOAL #3   Title Independent with initial HEP 03/14/17   Time 6   Period Weeks   Status Achieved           PT Long Term Goals - 02/26/17 1032      PT LONG TERM GOAL #1   Title Increase AROM Rt knee to (-) 2 deg extension to 110 deg flexion 04/25/17   Time 12   Period Weeks   Status Partially Met     PT LONG  TERM GOAL #2   Title 5/5/ strength Rt LE 04/25/17   Time 12   Period Weeks   Status On-going     PT LONG TERM GOAL #3   Title Ambulate without assistive device with good gait pattern for functional distances and at least 20-30 min  04/25/17   Time 12   Period Weeks   Status Achieved     PT LONG TERM GOAL #4   Title Improve FOTO to </= 31% limitation 04/25/17   Time 12   Period Weeks   Status On-going     PT LONG TERM GOAL #5   Title Independent in HEP 04/25/17   Time 12   Period Weeks   Status On-going               Plan - 03/01/17 1129    Clinical Impression Statement Pt tolerated all exercises well, with minimal increase in pain.  He continues to making gradual gains towards remaining goals.    Rehab Potential Good   PT Frequency 2x / week   PT Duration 12 weeks   PT Treatment/Interventions Patient/family education;ADLs/Self Care Home Management;Cryotherapy;Electrical Stimulation;Iontophoresis 50m/ml Dexamethasone;Moist Heat;Ultrasound;Gait training;Dry needling;Manual techniques;Vasopneumatic Device;Therapeutic activities;Therapeutic  exercise   PT Next Visit Plan continue progressive Rt knee ROM, strengthening. Modalities as indicated.    Consulted and Agree with Plan of Care Patient      Patient will benefit from skilled therapeutic intervention in order to improve the following deficits and impairments:  Postural dysfunction, Improper body mechanics, Pain, Decreased range of motion, Decreased mobility, Decreased strength, Abnormal gait, Decreased activity tolerance  Visit Diagnosis: Acute pain of left knee  Muscle weakness (generalized)  Stiffness of left knee, not elsewhere classified  Other abnormalities of gait and mobility     Problem List Patient Active Problem List   Diagnosis Date Noted  . Primary osteoarthritis of both knees 11/02/2015   JKerin Perna PTA 03/01/17 11:34 AM  [place] [address]Phone: [phone]  Fax:  [phone] Name: Terry CorsinoMRN: [id number] of Birth: [birth year]

## 2017-03-06 ENCOUNTER — Ambulatory Visit (INDEPENDENT_AMBULATORY_CARE_PROVIDER_SITE_OTHER): Payer: PRIVATE HEALTH INSURANCE | Admitting: Physical Therapy

## 2017-03-06 DIAGNOSIS — M25662 Stiffness of left knee, not elsewhere classified: Secondary | ICD-10-CM

## 2017-03-06 DIAGNOSIS — M6281 Muscle weakness (generalized): Secondary | ICD-10-CM

## 2017-03-06 DIAGNOSIS — M25562 Pain in left knee: Secondary | ICD-10-CM | POA: Diagnosis not present

## 2017-03-06 NOTE — Therapy (Signed)
[place], [place], [address] Phone: [phone]   Fax:  [phone]  Physical Therapy Treatment  Patient Details  Name: Terry Greene MRN: [id number] Date of Birth: 05/28/1956 Referring Provider: Dr. Jeneen Rinks Mitchell/ Dr. [name]  Encounter Date: 03/06/2017      PT End of Session - 03/06/17 0940    Visit Number 10   Number of Visits 24   Date for PT Re-Evaluation 04/25/17   PT Start Time 0930   PT Stop Time 1029   PT Time Calculation (min) 59 min   Activity Tolerance Patient tolerated treatment well   Behavior During Therapy [facility] for tasks assessed/performed      No past medical history on file.  No past surgical history on file.  There were no vitals filed for this visit.      Subjective Assessment - 03/06/17 0940    Subjective Pt reports he fell 2 days ago after stepping in a hole, "My (Rt) leg folded on itself".  Pt hasn't called doctor, just iced and elevated.     Currently in Pain? Yes   Pain Score 6    Pain Location Knee   Pain Orientation Right   Pain Descriptors / Indicators Aching;Dull   Aggravating Factors  bending Rt knee past comfortable position   Pain Relieving Factors ice, medication.             [facility] PT Assessment - 03/06/17 0001      Assessment   Medical Diagnosis Rt TKA   Referring Provider Dr. Jeneen Rinks Mitchell/ Dr. [name]   Onset Date/Surgical Date 01/19/17   Hand Dominance Right     AROM   Right Knee Extension 0   Right Knee Flexion 110  seated scoot; 106 deg in sitting          [facility] Adult PT Treatment/Exercise - 03/06/17 0001      Knee/Hip Exercises: Stretches   Other Knee/Hip Stretches standing Rt lunge on 13" step (for Rt knee ROM) hold 10 sec, x 5 reps, followed by Hamstring stretch.      Knee/Hip Exercises: Aerobic   Recumbent Bike slow full circles for ROM x 3 min, L1 x 3 min     Knee/Hip Exercises: Standing   Side Lunges 1  set;Right;Left;20 reps   Functional Squat 1 set;15 reps   SLS Rt SLS forward leans to chair x 10 reps, repeated on Lt    Gait Training 4 laps around gym in between exercises to decrease stiffness.      Knee/Hip Exercises: Seated   Heel Slides Limitations seated scoots, 10 sec hold x 5 reps to increased Rt knee flexion ROM.  pt paused in between reps to massage knee     Knee/Hip Exercises: Prone   Hamstring Curl 2 sets;10 reps  3# at ankle, then 4#   Prone Knee Hang 1 minute  2 reps, 3# rep 1, 4# rep 2     Electrical Stimulation   Electrical Stimulation Location Rt knee    Electrical Stimulation Action ion repelling   Electrical Stimulation Parameters to tolerance    Electrical Stimulation Goals Edema;Pain     Vasopneumatic   Number Minutes Vasopneumatic  15 minutes   Vasopnuematic Location  Knee   Vasopneumatic Pressure Low   Vasopneumatic Temperature  34 deg     Manual Therapy   Manual therapy comments I strip of Rock tape placed over incision on Rt knee to assist with scar management (25% stretch,  zigzag pattern)                  PT Short Term Goals - 03/01/17 1134      PT SHORT TERM GOAL #1   Title Increase AROM to (-)4 to 100 deg 03/14/17   Time 6   Period Weeks   Status Achieved     PT SHORT TERM GOAL #2   Title Patient to ambulate with good gait pattern with SPC for functional distances of at least 10-15 min 03/14/17   Time 6   Period Weeks   Status Achieved     PT SHORT TERM GOAL #3   Title Independent with initial HEP 03/14/17   Time 6   Period Weeks   Status Achieved           PT Long Term Goals - 03/06/17 1016      PT LONG TERM GOAL #1   Title Increase AROM Rt knee to (-) 2 deg extension to 110 deg flexion 04/25/17   Time 12   Period Weeks   Status Achieved     PT LONG TERM GOAL #2   Title 5/5/ strength Rt LE 04/25/17   Time 12   Period Weeks   Status On-going     PT LONG TERM GOAL #3   Title Ambulate without assistive device with  good gait pattern for functional distances and at least 20-30 min  04/25/17   Time 12   Period Weeks   Status Achieved     PT LONG TERM GOAL #4   Title Improve FOTO to </= 31% limitation 04/25/17   Time 12   Period Weeks   Status On-going     PT LONG TERM GOAL #5   Title Independent in HEP 04/25/17   Time 12   Period Weeks   Status On-going               Plan - 03/06/17 1026    Clinical Impression Statement Pt demonstrated improved Rt knee ROM; has met LTG#1.  He tolerated all exercises with minimal increase in pain.  Gait quality improving each session (observed throughout clinic).     Rehab Potential Good   PT Frequency 2x / week   PT Duration 12 weeks   PT Treatment/Interventions Patient/family education;ADLs/Self Care Home Management;Cryotherapy;Electrical Stimulation;Iontophoresis 11m/ml Dexamethasone;Moist Heat;Ultrasound;Gait training;Dry needling;Manual techniques;Vasopneumatic Device;Therapeutic activities;Therapeutic exercise   PT Next Visit Plan continue progressive Rt knee ROM, strengthening. Modalities as indicated.    Consulted and Agree with Plan of Care Patient      Patient will benefit from skilled therapeutic intervention in order to improve the following deficits and impairments:  Postural dysfunction, Improper body mechanics, Pain, Decreased range of motion, Decreased mobility, Decreased strength, Abnormal gait, Decreased activity tolerance  Visit Diagnosis: Acute pain of left knee  Muscle weakness (generalized)  Stiffness of left knee, not elsewhere classified     Problem List Patient Active Problem List   Diagnosis Date Noted  . Primary osteoarthritis of both knees 11/02/2015   JKerin Perna PTA 03/06/17 10:37 AM  [facility] [address]Phone: [phone]  Fax:  [phone] Name: Terry GintzMRN: [id number] of Birth:  408-21-1957

## 2017-03-09 ENCOUNTER — Ambulatory Visit (INDEPENDENT_AMBULATORY_CARE_PROVIDER_SITE_OTHER): Payer: PRIVATE HEALTH INSURANCE | Admitting: Physical Therapy

## 2017-03-09 ENCOUNTER — Ambulatory Visit (INDEPENDENT_AMBULATORY_CARE_PROVIDER_SITE_OTHER): Payer: PRIVATE HEALTH INSURANCE | Admitting: Sports Medicine

## 2017-03-09 DIAGNOSIS — M25662 Stiffness of left knee, not elsewhere classified: Secondary | ICD-10-CM | POA: Diagnosis not present

## 2017-03-09 DIAGNOSIS — M25562 Pain in left knee: Secondary | ICD-10-CM | POA: Diagnosis not present

## 2017-03-09 DIAGNOSIS — M170 Bilateral primary osteoarthritis of knee: Secondary | ICD-10-CM | POA: Diagnosis not present

## 2017-03-09 DIAGNOSIS — M6281 Muscle weakness (generalized): Secondary | ICD-10-CM | POA: Diagnosis not present

## 2017-03-09 NOTE — Therapy (Signed)
[facility] [place] [address] [place], [place], [address] Phone: [phone]   Fax:  [phone]  Physical Therapy Treatment  Patient Details  Name: Terry Greene MRN: [id number] Date of Birth: 1956-07-04 Referring Provider: Dr. Juliann Pulse / Dr. Benjamin Stain  Encounter Date: 03/09/2017      PT End of Session - 03/09/17 0948    Visit Number 11   Number of Visits 24   Date for PT Re-Evaluation 04/25/17   PT Start Time 0930   PT Stop Time 1030   PT Time Calculation (min) 60 min   Activity Tolerance Patient tolerated treatment well   Behavior During Therapy [facility] for tasks assessed/performed      No past medical history on file.  No past surgical history on file.  There were no vitals filed for this visit.      Subjective Assessment - 03/09/17 1041    Subjective Pt continues to have difficulty sleeping due to pain in Rt knee.  He is trying to wean from medication when outside of therapy sessions.     Currently in Pain? Yes   Pain Score 3   took pain medicine 1 hour prior to treatment   Pain Location Knee   Pain Orientation Right   Pain Descriptors / Indicators Aching   Aggravating Factors  bending knee past comfortable position, sleep position   Pain Relieving Factors ice, elevation, medication             [facility] PT Assessment - 03/09/17 0001      Assessment   Medical Diagnosis Rt TKA   Referring Provider Dr. Juliann Pulse / Dr. Benjamin Stain   Onset Date/Surgical Date 01/19/17   Hand Dominance Right   Next MD Visit 03/09/2017     AROM   Right Knee Extension 0   Right Knee Flexion 104     Flexibility   Quadriceps Rt knee flexion of 97 deg          [facility] Adult PT Treatment/Exercise - 03/09/17 0001      Knee/Hip Exercises: Stretches   Lobbyist Right;3 reps;60 seconds   Quad Stretch Limitations towel under knee   Gastroc Stretch Right;Left;2 reps;30 seconds     Knee/Hip Exercises: Aerobic    Recumbent Bike slow circles initially for ROM, then L1-4 x 8 min total.      Knee/Hip Exercises: Standing   Lateral Step Up Right;1 set;Hand Hold: 1;Step Height: 8"   Forward Step Up Right;1 set;15 reps;Hand Hold: 0;Step Height: 8"   SLS Rt/Lt SLS on BOSU (too challenging); changed to grey disc x 15 sec x 3 reps each side;  Rt SLs forward leans to chair height reach x 10 reps, 5 reps on Lt    Gait Training --   Other Standing Knee Exercises Retro gait, slow with focus on TKE - 50 ft      Knee/Hip Exercises: Prone   Hamstring Curl 1 set;5 reps;2 seconds  4#   Prone Knee Hang 2 minutes   Prone Knee Hang Weights (lbs) 4     Electrical Stimulation   Electrical Stimulation Location Rt knee    Electrical Stimulation Action ion repelling   Electrical Stimulation Parameters to tolerance    Electrical Stimulation Goals Edema;Pain     Vasopneumatic   Number Minutes Vasopneumatic  15 minutes   Vasopnuematic Location  Knee   Vasopneumatic Pressure Low   Vasopneumatic Temperature  34 deg     Manual Therapy   Manual therapy  comments tape repmoved from previous application.                   PT Short Term Goals - 03/01/17 1134      PT SHORT TERM GOAL #1   Title Increase AROM to (-)4 to 100 deg 03/14/17   Time 6   Period Weeks   Status Achieved     PT SHORT TERM GOAL #2   Title Patient to ambulate with good gait pattern with SPC for functional distances of at least 10-15 min 03/14/17   Time 6   Period Weeks   Status Achieved     PT SHORT TERM GOAL #3   Title Independent with initial HEP 03/14/17   Time 6   Period Weeks   Status Achieved           PT Long Term Goals - 03/06/17 1016      PT LONG TERM GOAL #1   Title Increase AROM Rt knee to (-) 2 deg extension to 110 deg flexion 04/25/17   Time 12   Period Weeks   Status Achieved     PT LONG TERM GOAL #2   Title 5/5/ strength Rt LE 04/25/17   Time 12   Period Weeks   Status On-going     PT LONG TERM GOAL #3    Title Ambulate without assistive device with good gait pattern for functional distances and at least 20-30 min  04/25/17   Time 12   Period Weeks   Status Achieved     PT LONG TERM GOAL #4   Title Improve FOTO to </= 31% limitation 04/25/17   Time 12   Period Weeks   Status On-going     PT LONG TERM GOAL #5   Title Independent in HEP 04/25/17   Time 12   Period Weeks   Status On-going               Plan - 03/09/17 1047    Clinical Impression Statement Pt continues with some pitting edema in Rt knee; pt encouraged to use RICE principles to reduce edema.  Pt tolerated all exercises well without increase in pain/symptoms.  Progressing well towards goals.    Rehab Potential Good   PT Frequency 2x / week   PT Duration 12 weeks   PT Treatment/Interventions Patient/family education;ADLs/Self Care Home Management;Cryotherapy;Electrical Stimulation;Iontophoresis 4mg /ml Dexamethasone;Moist Heat;Ultrasound;Gait training;Dry needling;Manual techniques;Vasopneumatic Device;Therapeutic activities;Therapeutic exercise   PT Next Visit Plan continue progressive Rt knee ROM, strengthening. Modalities as indicated.    Consulted and Agree with Plan of Care Patient      Patient will benefit from skilled therapeutic intervention in order to improve the following deficits and impairments:  Postural dysfunction, Improper body mechanics, Pain, Decreased range of motion, Decreased mobility, Decreased strength, Abnormal gait, Decreased activity tolerance  Visit Diagnosis: Acute pain of left knee  Muscle weakness (generalized)  Stiffness of left knee, not elsewhere classified     Problem List Patient Active Problem List   Diagnosis Date Noted  . Primary osteoarthritis of both knees 11/02/2015   Mayer CamelJennifer Greene, PTA 03/09/17 10:48 AM  [facility] [place] [address] [place], [place], [address] Phone: [phone]   Fax:   [phone]  Name: Terry CellaMichael Greene MRN: [id number] Date of Birth: 03/28/1956

## 2017-03-09 NOTE — Progress Notes (Signed)
    Patient was fitted for a : standard, cushioned, semi-rigid orthotic. The orthotic was heated and afterward the patient stood on the orthotic blank positioned on the orthotic stand. The patient was positioned in subtalar neutral position and 10 degrees of ankle dorsiflexion in a weight bearing stance. After completion of molding, a stable base was applied to the orthotic blank. The blank was ground to a stable position for weight bearing. Size: 12 Base: [facility] Additional Posting and Padding: Heel lift on the left The patient ambulated these, and they were very comfortable.  I spent 40 minutes with this patient, greater than 50% was face-to-face time counseling regarding the below diagnosis.

## 2017-03-13 ENCOUNTER — Ambulatory Visit (INDEPENDENT_AMBULATORY_CARE_PROVIDER_SITE_OTHER): Payer: PRIVATE HEALTH INSURANCE | Admitting: Physical Therapy

## 2017-03-13 DIAGNOSIS — M6281 Muscle weakness (generalized): Secondary | ICD-10-CM

## 2017-03-13 DIAGNOSIS — M25662 Stiffness of left knee, not elsewhere classified: Secondary | ICD-10-CM

## 2017-03-13 DIAGNOSIS — R2689 Other abnormalities of gait and mobility: Secondary | ICD-10-CM

## 2017-03-13 DIAGNOSIS — M25562 Pain in left knee: Secondary | ICD-10-CM | POA: Diagnosis not present

## 2017-03-13 NOTE — Therapy (Signed)
[facility] [place] [address] [place], [place], [address] Phone: [phone]   Fax:  [phone]  Physical Therapy Treatment  Patient Details  Name: Terry Greene MRN: [id number] Date of Birth: 13-May-1956 Referring Provider: Dr. Juliann Pulse / Dr. Benjamin Stain  Encounter Date: 03/13/2017      PT End of Session - 03/13/17 0958    Visit Number 12   Number of Visits 24   Date for PT Re-Evaluation 04/25/17   PT Start Time 0929   PT Stop Time 1031   PT Time Calculation (min) 62 min      No past medical history on file.  No past surgical history on file.  There were no vitals filed for this visit.      Subjective Assessment - 03/13/17 1000    Subjective Pt reports last night was the first night he slept 6 hours prior to waking.  He is only taking pain medicine prior to therapy.  He would like to be able perform a deep squat to look at the green for golf.    Currently in Pain? Yes   Pain Score 3    Pain Location Knee   Pain Orientation Right   Pain Descriptors / Indicators Aching            [facility] PT Assessment - 03/13/17 0001      AROM   Right Knee Flexion 107          [facility] Adult PT Treatment/Exercise - 03/13/17 0001      Knee/Hip Exercises: Stretches   Lobbyist Right;3 reps;60 seconds   Quad Stretch Limitations towel under knee   Gastroc Stretch Right;Left;4 reps;30 seconds   Other Knee/Hip Stretches standing Rt lunge on 13" step (for Rt knee ROM) hold 10 sec, x 5 reps, followed by Hamstring stretch.      Knee/Hip Exercises: Aerobic   Recumbent Bike L3: 8 min      Knee/Hip Exercises: Standing   Lateral Step Up Right;1 set;Hand Hold: 1;10 reps  13" step   Forward Step Up Right;1 set;15 reps;Hand Hold: 1  13" step, controlled descent backwards.    Other Standing Knee Exercises split lunges x 10 reps each leg (modified depth);  high kneeling on 3" pad with light stretch of buttocks towards heels  with UE support x 15 sec x 3 reps      Knee/Hip Exercises: Seated   Other Seated Knee/Hip Exercises seated scoot x 10 sec hold x 5 reps      Electrical Stimulation   Electrical Stimulation Location Rt knee    Electrical Stimulation Action ion repelling   Electrical Stimulation Parameters to tolerance    Electrical Stimulation Goals Edema;Pain     Vasopneumatic   Number Minutes Vasopneumatic  15 minutes   Vasopnuematic Location  Knee   Vasopneumatic Pressure Low   Vasopneumatic Temperature  34 deg                  PT Short Term Goals - 03/01/17 1134      PT SHORT TERM GOAL #1   Title Increase AROM to (-)4 to 100 deg 03/14/17   Time 6   Period Weeks   Status Achieved     PT SHORT TERM GOAL #2   Title Patient to ambulate with good gait pattern with SPC for functional distances of at least 10-15 min 03/14/17   Time 6   Period Weeks   Status Achieved     PT  SHORT TERM GOAL #3   Title Independent with initial HEP 03/14/17   Time 6   Period Weeks   Status Achieved           PT Long Term Goals - 03/06/17 1016      PT LONG TERM GOAL #1   Title Increase AROM Rt knee to (-) 2 deg extension to 110 deg flexion 04/25/17   Time 12   Period Weeks   Status Achieved     PT LONG TERM GOAL #2   Title 5/5/ strength Rt LE 04/25/17   Time 12   Period Weeks   Status On-going     PT LONG TERM GOAL #3   Title Ambulate without assistive device with good gait pattern for functional distances and at least 20-30 min  04/25/17   Time 12   Period Weeks   Status Achieved     PT LONG TERM GOAL #4   Title Improve FOTO to </= 31% limitation 04/25/17   Time 12   Period Weeks   Status On-going     PT LONG TERM GOAL #5   Title Independent in HEP 04/25/17   Time 12   Period Weeks   Status On-going               Plan - 03/13/17 1259    Clinical Impression Statement Pt's Rt knee ROM similar to last assessment.  He tolerated all exercises well, however noted a burning  sensation in lateral distal quad with step ups; relieved with rest and self massage to area. Pt making good gains towards remaining goals.    Rehab Potential Good   PT Frequency 2x / week   PT Duration 12 weeks   PT Treatment/Interventions Patient/family education;ADLs/Self Care Home Management;Cryotherapy;Electrical Stimulation;Iontophoresis 4mg /ml Dexamethasone;Moist Heat;Ultrasound;Gait training;Dry needling;Manual techniques;Vasopneumatic Device;Therapeutic activities;Therapeutic exercise   PT Next Visit Plan continue progressive Rt knee ROM, strengthening. Modalities as indicated.  Assess LE strength.   Consulted and Agree with Plan of Care Patient      Patient will benefit from skilled therapeutic intervention in order to improve the following deficits and impairments:  Postural dysfunction, Improper body mechanics, Pain, Decreased range of motion, Decreased mobility, Decreased strength, Abnormal gait, Decreased activity tolerance  Visit Diagnosis: Acute pain of left knee  Muscle weakness (generalized)  Stiffness of left knee, not elsewhere classified  Other abnormalities of gait and mobility     Problem List Patient Active Problem List   Diagnosis Date Noted  . Left knee osteoarthritis, post right knee arthroplasty 11/02/2015   Mayer CamelJennifer [place], PTA 03/13/17 1:07 PM  [facility] [place] [address] [place], [place], [address] Phone: [phone]   Fax:  [phone]  Name: Terry Greene MRN: [id number] Date of Birth: Sep 01, 1956

## 2017-03-16 ENCOUNTER — Ambulatory Visit (INDEPENDENT_AMBULATORY_CARE_PROVIDER_SITE_OTHER): Payer: PRIVATE HEALTH INSURANCE | Admitting: Rehabilitative and Restorative Service Providers"

## 2017-03-16 DIAGNOSIS — M25562 Pain in left knee: Secondary | ICD-10-CM

## 2017-03-16 DIAGNOSIS — M6281 Muscle weakness (generalized): Secondary | ICD-10-CM

## 2017-03-16 DIAGNOSIS — R2689 Other abnormalities of gait and mobility: Secondary | ICD-10-CM

## 2017-03-16 DIAGNOSIS — M25662 Stiffness of left knee, not elsewhere classified: Secondary | ICD-10-CM

## 2017-03-16 NOTE — Therapy (Signed)
[facility] [place] [address] [place], [place], [address] Phone: [phone]   Fax:  [phone]  Physical Therapy Treatment  Patient Details  Name: Terry Greene MRN: [id number] Date of Birth: 19-Nov-1955 Referring Provider: Dr Fayrene Fearing Mitchell/Dr Benjamin Stain  Encounter Date: 03/16/2017      PT End of Session - 03/16/17 0931    Visit Number 13   Number of Visits 24   Date for PT Re-Evaluation 04/25/17   PT Start Time 0929   PT Stop Time 1030   PT Time Calculation (min) 61 min   Activity Tolerance Patient tolerated treatment well      No past medical history on file.  No past surgical history on file.  There were no vitals filed for this visit.      Subjective Assessment - 03/16/17 0932    Subjective Patient reports that he slept better again last night - wonders if that is related to taking the pain meds. He is now taking less of the pain medication. Returns to work Monday.   Currently in Pain? Yes   Pain Score 3    Pain Location Knee   Pain Orientation Right   Pain Descriptors / Indicators Aching   Pain Type Surgical pain   Pain Onset More than a month ago   Pain Frequency Intermittent            [facility] PT Assessment - 03/16/17 0001      Assessment   Medical Diagnosis Rt TKA   Referring Provider Dr Fayrene Fearing Mitchell/Dr Benjamin Stain   Onset Date/Surgical Date 01/19/17   Hand Dominance Right   Prior Therapy 2 days in hospital     AROM   Right Knee Flexion 107                     [facility] Adult PT Treatment/Exercise - 03/16/17 0001      Knee/Hip Exercises: Stretches   Lobbyist Right;3 reps;60 seconds   Quad Stretch Limitations 4 inch foam roll distal thigh    Gastroc Stretch Right;Left;4 reps;30 seconds   Other Knee/Hip Stretches patient supine bending knee w/ assist of strap 30-60 sec hold x 5      Knee/Hip Exercises: Aerobic   Recumbent Bike L3: 8 min      Knee/Hip Exercises: Standing    Lateral Step Up Right;1 set;Hand Hold: 1;10 reps  13" step   Lateral Step Up Limitations lateral heel touch 4 inch step 2 sets of 10    Forward Step Up Right;1 set;15 reps;Hand Hold: 1  13" step, controlled descent backwards.    Forward Step Up Limitations 15 reps on 6 inch step    SLS Rt/Lt SLS grey disc x 20-30 sec x 2 reps each side;  Rt SLs forward leans to chair height reach x 10 reps, 5 reps on Lt      Knee/Hip Exercises: Supine   Other Supine Knee/Hip Exercises pt holding posterior thigh with hip and knee at 90 deg allowing gravity to assist with knee flexion 15-20 sec x 3      Electrical Stimulation   Electrical Stimulation Location Rt knee    Landscape architect Parameters to Pharmacologist Goals Edema;Pain     Vasopneumatic   Number Minutes Vasopneumatic  15 minutes   Vasopnuematic Location  Knee   Vasopneumatic Pressure Low   Vasopneumatic Temperature  34 deg     Manual Therapy  Joint Mobilization PA glide Rt hip pt prone; PA and AP glides Rt knee supine hooklying    Soft tissue mobilization working through the distal quad - added massage stick for quads    Passive ROM overpressure into flexion of Rt knee                PT Education - 03/16/17 1024    Education provided Yes   Education Details HEP DN    Person(s) Educated Patient   Methods Explanation;Demonstration;Tactile cues;Verbal cues;Handout   Comprehension Verbalized understanding;Returned demonstration;Verbal cues required;Tactile cues required          PT Short Term Goals - 03/01/17 1134      PT SHORT TERM GOAL #1   Title Increase AROM to (-)4 to 100 deg 03/14/17   Time 6   Period Weeks   Status Achieved     PT SHORT TERM GOAL #2   Title Patient to ambulate with good gait pattern with SPC for functional distances of at least 10-15 min 03/14/17   Time 6   Period Weeks   Status Achieved     PT SHORT TERM GOAL #3    Title Independent with initial HEP 03/14/17   Time 6   Period Weeks   Status Achieved           PT Long Term Goals - 03/16/17 0931      PT LONG TERM GOAL #1   Title Increase AROM Rt knee to (-) 2 deg extension to 110 deg flexion 04/25/17   Time 12   Period Weeks   Status Achieved     PT LONG TERM GOAL #2   Title 5/5/ strength Rt LE 04/25/17   Time 12   Period Weeks   Status On-going     PT LONG TERM GOAL #3   Title Ambulate without assistive device with good gait pattern for functional distances and at least 20-30 min  04/25/17   Time 12   Period Weeks   Status Achieved     PT LONG TERM GOAL #4   Title Improve FOTO to </= 31% limitation 04/25/17   Time 12   Period Weeks   Status On-going     PT LONG TERM GOAL #5   Title Independent in HEP 04/25/17   Time 12   Period Weeks   Status On-going               Plan - 03/16/17 0935    Clinical Impression Statement Gradual improvement continues. Terry Greene continues to work well in therapy and with HEP. He is progressing well toward stated goals of therapy.    Rehab Potential Good   PT Frequency 2x / week   PT Duration 12 weeks   PT Treatment/Interventions Patient/family education;ADLs/Self Care Home Management;Cryotherapy;Electrical Stimulation;Iontophoresis 4mg /ml Dexamethasone;Moist Heat;Ultrasound;Gait training;Dry needling;Manual techniques;Vasopneumatic Device;Therapeutic activities;Therapeutic exercise   PT Next Visit Plan continue progressive Rt knee ROM, strengthening. Modalities as indicated.  Assess LE strength. Work on quad tightness manually/US/possibly DN    Consulted and Agree with Plan of Care Patient      Patient will benefit from skilled therapeutic intervention in order to improve the following deficits and impairments:  Postural dysfunction, Improper body mechanics, Pain, Decreased range of motion, Decreased mobility, Decreased strength, Abnormal gait, Decreased activity tolerance  Visit Diagnosis: Acute  pain of left knee  Muscle weakness (generalized)  Stiffness of left knee, not elsewhere classified  Other abnormalities of gait and mobility     Problem List  Patient Active Problem List   Diagnosis Date Noted  . Left knee osteoarthritis, post right knee arthroplasty 11/02/2015    Terry Greene PT, MPH  03/16/2017, 10:28 AM  [facility] [address] [place], [place], [address] Phone: [phone]   Fax:  [phone]  Name: Terry Greene MRN: [id number] Date of Birth: April 10, 1956

## 2017-03-16 NOTE — Patient Instructions (Signed)
Massage stick for quad  Lying on back hold thigh up with hip and knee at 90 degrees; allowing gravity to bend knee  Hold 30-60 sec several reps   Trigger Point Dry Needling  . What is Trigger Point Dry Needling (DN)? o DN is a physical therapy technique used to treat muscle pain and dysfunction. Specifically, DN helps deactivate muscle trigger points (muscle knots).  o A thin filiform needle is used to penetrate the skin and stimulate the underlying trigger point. The goal is for a local twitch response (LTR) to occur and for the trigger point to relax. No medication of any kind is injected during the procedure.   . What Does Trigger Point Dry Needling Feel Like?  o The procedure feels different for each individual patient. Some patients report that they do not actually feel the needle enter the skin and overall the process is not painful. Very mild bleeding may occur. However, many patients feel a deep cramping in the muscle in which the needle was inserted. This is the local twitch response.   Marland Kitchen. How Will I feel after the treatment? o Soreness is normal, and the onset of soreness may not occur for a few hours. Typically this soreness does not last longer than two days.  o Bruising is uncommon, however; ice can be used to decrease any possible bruising.  o In rare cases feeling tired or nauseous after the treatment is normal. In addition, your symptoms may get worse before they get better, this period will typically not last longer than 24 hours.   . What Can I do After My Treatment? o Increase your hydration by drinking more water for the next 24 hours. o You may place ice or heat on the areas treated that have become sore, however, do not use heat on inflamed or bruised areas. Heat often brings more relief post needling. o You can continue your regular activities, but vigorous activity is not recommended initially after the treatment for 24 hours. o DN is best combined with other physical  therapy such as strengthening, stretching, and other therapies.

## 2017-03-19 ENCOUNTER — Ambulatory Visit (INDEPENDENT_AMBULATORY_CARE_PROVIDER_SITE_OTHER): Payer: PRIVATE HEALTH INSURANCE | Admitting: Physical Therapy

## 2017-03-19 DIAGNOSIS — M25562 Pain in left knee: Secondary | ICD-10-CM

## 2017-03-19 DIAGNOSIS — M6281 Muscle weakness (generalized): Secondary | ICD-10-CM | POA: Diagnosis not present

## 2017-03-19 DIAGNOSIS — M25662 Stiffness of left knee, not elsewhere classified: Secondary | ICD-10-CM

## 2017-03-19 NOTE — Therapy (Signed)
Terry Greene, [place], [address] Phone: [phone]   Fax:  [phone]  Physical Therapy Treatment  Patient Details  Name: Terry Greene MRN: [id number] Date of Birth: 09-Feb-1956 Referring Provider: Dr. Jeneen Rinks Mitchell/ Dr. [name]  Encounter Date: 03/19/2017      PT End of Session - 03/19/17 0722    Visit Number 14   Number of Visits 24   Date for PT Re-Evaluation 04/25/17   PT Start Time 0715   PT Stop Time 0815   PT Time Calculation (min) 60 min   Activity Tolerance Patient tolerated treatment well   Behavior During Therapy [facility] for tasks assessed/performed      No past medical history on file.  No past surgical history on file.  There were no vitals filed for this visit.      Subjective Assessment - 03/19/17 0718    Subjective Terry Greene reports he started using an Ace wrap to his Rt knee during day this weekend and he noticed it helped significantly keep swelling and pain down.  Pt returns to work today.     Currently in Pain? No/denies   Pain Score 0-No pain            [facility] PT Assessment - 03/19/17 0001      Assessment   Medical Diagnosis Rt TKA   Referring Provider Dr. Jeneen Rinks Mitchell/ Dr. [name]   Onset Date/Surgical Date 01/19/17   Hand Dominance Right   Next MD Visit 03/09/2017     AROM   Right Knee Extension 0   Right Knee Flexion 107     Strength   Right Hip Flexion 5/5   Right Hip Extension 5/5   Right Hip ABduction 5/5   Right Knee Flexion 4+/5   Right Knee Extension 5/5          [facility] Adult PT Treatment/Exercise - 03/19/17 0001      Knee/Hip Exercises: Stretches   Passive Hamstring Stretch Right;Left;2 reps;30 seconds   Quad Stretch Right;3 reps;60 seconds   Gastroc Stretch Right;Left;30 seconds;2 reps     Knee/Hip Exercises: Aerobic   Elliptical trial, 30 sec forward/ 30 sec backward.    Recumbent Bike L4: 5 min      Knee/Hip Exercises:  Machines for Strengthening   Other Machine prone hamstring curls RLE 1 plate, x 10 reps     Knee/Hip Exercises: Standing   Side Lunges Right;Left;1 set;10 reps   Step Down Left;1 set;10 reps;Hand Hold: 0;Step Height: 6"   Step Down Limitations (and reverse step up with RLE)   SLS one leg dead lifts to 6" step holding the 5# x 10 reps each side.    Other Standing Knee Exercises  high kneeling on 3" pad with light stretch of buttocks towards heels with UE support x 15 sec x 3 reps      Knee/Hip Exercises: Seated   Other Seated Knee/Hip Exercises seated scoot x 10 sec hold x 5 reps    Hamstring Curl Right;Left;1 set;10 reps  black band     Vasopneumatic   Number Minutes Vasopneumatic  15 minutes   Vasopnuematic Location  Knee   Vasopneumatic Pressure Low   Vasopneumatic Temperature  34 deg                  PT Short Term Goals - 03/01/17 1134      PT SHORT TERM GOAL #1   Title Increase AROM to (-)4 to 100 deg 03/14/17  Time 6   Period Weeks   Status Achieved     PT SHORT TERM GOAL #2   Title Patient to ambulate with good gait pattern with SPC for functional distances of at least 10-15 min 03/14/17   Time 6   Period Weeks   Status Achieved     PT SHORT TERM GOAL #3   Title Independent with initial HEP 03/14/17   Time 6   Period Weeks   Status Achieved           PT Long Term Goals - 03/19/17 0806      PT LONG TERM GOAL #1   Title Increase AROM Rt knee to (-) 2 deg extension to 110 deg flexion 04/25/17   Period Weeks   Status Partially Met     PT LONG TERM GOAL #2   Title 5/5/ strength Rt LE 04/25/17   Time 12   Period Weeks   Status Partially Met     PT LONG TERM GOAL #3   Title Ambulate without assistive device with good gait pattern for functional distances and at least 20-30 min  04/25/17   Time 12   Period Weeks   Status Achieved     PT LONG TERM GOAL #4   Title Improve FOTO to </= 31% limitation 04/25/17   Time 12   Period Weeks   Status On-going      PT LONG TERM GOAL #5   Title Independent in HEP 04/25/17   Time 12   Period Weeks   Status On-going               Plan - 03/19/17 0808    Clinical Impression Statement Pt's Rt knee ROM similar to last visit (0-107 deg).  His RLE strength has improved; continues with weakness in Rt hamstring with break test, and reports functional weakness with quads during eccentric exercises like stairs.  Pt would like to focus on Rt knee flexion ROM in future appointments (MD had goal of 120-125 deg, per pt). ,After this week, pt would like to decrease his frequency of appts.     Rehab Potential Good   PT Frequency 2x / week   PT Duration 12 weeks   PT Treatment/Interventions Patient/family education;ADLs/Self Care Home Management;Cryotherapy;Electrical Stimulation;Iontophoresis 52m/ml Dexamethasone;Moist Heat;Ultrasound;Gait training;Dry needling;Manual techniques;Vasopneumatic Device;Therapeutic activities;Therapeutic exercise   PT Next Visit Plan Work on quad tightness manually, possible [place] continue progresive Rt knee ROM/ strengthening.    Consulted and Agree with Plan of Care Patient      Patient will benefit from skilled therapeutic intervention in order to improve the following deficits and impairments:  Postural dysfunction, Improper body mechanics, Pain, Decreased range of motion, Decreased mobility, Decreased strength, Abnormal gait, Decreased activity tolerance  Visit Diagnosis: Acute pain of left knee  Muscle weakness (generalized)  Stiffness of left knee, not elsewhere classified     Problem List Patient Active Problem List   Diagnosis Date Noted  . Left knee osteoarthritis, post right knee arthroplasty 11/02/2015   JKerin Perna PTA 03/19/17 8:29 AM  [place] [address]Phone: [phone]  Fax:  [phone] Name: MSylvain HastenMRN: [id number] of Birth:  4August 24, 1957

## 2017-03-22 ENCOUNTER — Ambulatory Visit (INDEPENDENT_AMBULATORY_CARE_PROVIDER_SITE_OTHER): Payer: PRIVATE HEALTH INSURANCE | Admitting: Physical Therapy

## 2017-03-22 DIAGNOSIS — M25662 Stiffness of left knee, not elsewhere classified: Secondary | ICD-10-CM

## 2017-03-22 DIAGNOSIS — M25562 Pain in left knee: Secondary | ICD-10-CM

## 2017-03-22 DIAGNOSIS — M6281 Muscle weakness (generalized): Secondary | ICD-10-CM

## 2017-03-22 NOTE — Therapy (Signed)
[place], [place], [address] Phone: [phone]   Fax:  [phone]  Physical Therapy Treatment  Patient Details  Name: Terry Greene MRN: [id number] Date of Birth: Feb 14, 1956 Referring Provider: Dr. Jeneen Rinks Mitchell/ Dr. [name]  Encounter Date: 03/22/2017      PT End of Session - 03/22/17 0912    Visit Number 15   Number of Visits 24   Date for PT Re-Evaluation 04/25/17   PT Start Time 0845   PT Stop Time 0945   PT Time Calculation (min) 60 min   Activity Tolerance Patient tolerated treatment well   Behavior During Therapy [facility] for tasks assessed/performed      No past medical history on file.  No past surgical history on file.  There were no vitals filed for this visit.      Subjective Assessment - 03/22/17 0913    Subjective Pt reports his Rt knee pain last night was 8/10; possibly from prolonged walking yesterday.  He feels his Rt lower leg is swollen today.    Pertinent History Arthritis; HTN   Patient Stated Goals work on knee rehab like the doctor wants him to do   Currently in Pain? Yes   Pain Score 4    Pain Location Knee   Pain Orientation Right   Pain Descriptors / Indicators Aching   Aggravating Factors  bending knee past comfortable position   Pain Relieving Factors ice, elevation, medication            [facility] PT Assessment - 03/22/17 0001      Assessment   Medical Diagnosis Rt TKA   Onset Date/Surgical Date 01/19/17   Hand Dominance Right     AROM   Right Knee Extension 0   Right Knee Flexion 112          [facility] Adult PT Treatment/Exercise - 03/22/17 0001      Knee/Hip Exercises: Stretches   Passive Hamstring Stretch Right;3 reps   Sports administrator Right;5 reps;60 seconds   Quad Stretch Limitations 4 inch foam roll distal thigh    Gastroc Stretch Right;Left;30 seconds;2 reps     Knee/Hip Exercises: Aerobic   Recumbent Bike L3: 6 min      Knee/Hip  Exercises: Seated   Other Seated Knee/Hip Exercises seated scoot x 10 sec hold x 5 reps      Modalities   Modalities Passenger transport manager Location Rt knee    Agricultural consultant Stimulation Parameters to tolerance    Electrical Stimulation Goals Edema;Pain     Vasopneumatic   Number Minutes Vasopneumatic  15 minutes   Vasopnuematic Location  Knee   Vasopneumatic Pressure Low   Vasopneumatic Temperature  34 deg     Manual Therapy   Manual Therapy Soft tissue mobilization;Passive ROM   Joint Mobilization PA glide Rt hip pt prone   Soft tissue mobilization Edge tool assistance to Rt quad, ant tib, knee joint line to decrease fascial restrictions and assist in improved ROM.   Passive ROM overpressure into flexion of Rt knee                PT Education - 03/22/17 0915    Education provided Yes   Education Details elevation of leg for decreasing swelling, self ROM exercise for Rt knee.    Person(s) Educated Patient   Methods Explanation;Demonstration   Comprehension Verbalized understanding  PT Short Term Goals - 03/01/17 1134      PT SHORT TERM GOAL #1   Title Increase AROM to (-)4 to 100 deg 03/14/17   Time 6   Period Weeks   Status Achieved     PT SHORT TERM GOAL #2   Title Patient to ambulate with good gait pattern with SPC for functional distances of at least 10-15 min 03/14/17   Time 6   Period Weeks   Status Achieved     PT SHORT TERM GOAL #3   Title Independent with initial HEP 03/14/17   Time 6   Period Weeks   Status Achieved           PT Long Term Goals - 03/19/17 0806      PT LONG TERM GOAL #1   Title Increase AROM Rt knee to (-) 2 deg extension to 110 deg flexion 04/25/17   Period Weeks   Status Partially Met     PT LONG TERM GOAL #2   Title 5/5/ strength Rt LE 04/25/17   Time 12   Period Weeks   Status Partially Met     PT  LONG TERM GOAL #3   Title Ambulate without assistive device with good gait pattern for functional distances and at least 20-30 min  04/25/17   Time 12   Period Weeks   Status Achieved     PT LONG TERM GOAL #4   Title Improve FOTO to </= 31% limitation 04/25/17   Time 12   Period Weeks   Status On-going     PT LONG TERM GOAL #5   Title Independent in HEP 04/25/17   Time 12   Period Weeks   Status On-going               Plan - 03/22/17 0936    Clinical Impression Statement Pt demonstrated improved Rt knee flexion ROM to 112 deg, AAROM in supine.  He tolerated treatment with slight increase in pain during ROM exercises; reduced with estim, elevation and vaso.  Pt progressing towards goals.    Rehab Potential Good   PT Frequency 2x / week   PT Duration 12 weeks   PT Treatment/Interventions Patient/family education;ADLs/Self Care Home Management;Cryotherapy;Electrical Stimulation;Iontophoresis 41m/ml Dexamethasone;Moist Heat;Ultrasound;Gait training;Dry needling;Manual techniques;Vasopneumatic Device;Therapeutic activities;Therapeutic exercise   PT Next Visit Plan Work on quad tightness manually, possible [place] continue progressive Rt knee ROM/ strengthening.    Consulted and Agree with Plan of Care Patient      Patient will benefit from skilled therapeutic intervention in order to improve the following deficits and impairments:  Postural dysfunction, Improper body mechanics, Pain, Decreased range of motion, Decreased mobility, Decreased strength, Abnormal gait, Decreased activity tolerance  Visit Diagnosis: Acute pain of left knee  Muscle weakness (generalized)  Stiffness of left knee, not elsewhere classified     Problem List Patient Active Problem List   Diagnosis Date Noted  . Left knee osteoarthritis, post right knee arthroplasty 11/02/2015   JKerin Perna PTA 03/22/17 1:02 PM  [place] [address]Phone: [phone]  Fax:  [phone] Name: Terry CaradineMRN: [id number] of Birth: 406-01-1956

## 2017-03-26 ENCOUNTER — Ambulatory Visit (INDEPENDENT_AMBULATORY_CARE_PROVIDER_SITE_OTHER): Payer: PRIVATE HEALTH INSURANCE | Admitting: Physical Therapy

## 2017-03-26 DIAGNOSIS — M6281 Muscle weakness (generalized): Secondary | ICD-10-CM

## 2017-03-26 DIAGNOSIS — R2689 Other abnormalities of gait and mobility: Secondary | ICD-10-CM | POA: Diagnosis not present

## 2017-03-26 DIAGNOSIS — M25562 Pain in left knee: Secondary | ICD-10-CM

## 2017-03-26 DIAGNOSIS — M25662 Stiffness of left knee, not elsewhere classified: Secondary | ICD-10-CM

## 2017-03-26 NOTE — Therapy (Signed)
[facility] [address] , [place], [address] Phone: [phone]   Fax:  [phone]  Physical Therapy Treatment  Patient Details  Name: Terry Greene MRN: [id number] Date of Birth: 07/15/1956 Referring Provider: Dr. James Mitchell/ Dr. Thekkekandam  Encounter Date: 03/26/2017      PT End of Session - 03/26/17 1713    Visit Number 16   Number of Visits 24   Date for PT Re-Evaluation 04/25/17   PT Start Time 1515   PT Stop Time 1612   PT Time Calculation (min) 57 min   Activity Tolerance Patient tolerated treatment well   Behavior During Therapy [facility] for tasks assessed/performed      No past medical history on file.  No past surgical history on file.  There were no vitals filed for this visit.      Subjective Assessment - 03/26/17 1520    Subjective Terry Greene reports his Rt knee is more painful today since he had to work full duty.  He has had less swelling since he has been elevating his leg high above heart once off of LE.    Patient Stated Goals work on knee rehab like the doctor wants him to do   Currently in Pain? Yes   Pain Score 4    Pain Location Knee   Pain Orientation Right   Pain Descriptors / Indicators Aching   Aggravating Factors  bending knee past comfortable position, prolonged standing   Pain Relieving Factors ice, elevation, medication            [facility] PT Assessment - 03/26/17 0001      Assessment   Medical Diagnosis Rt TKA   Onset Date/Surgical Date 01/19/17   Hand Dominance Right   Next MD Visit PRN    Prior Therapy 2 days in hospital     AROM   Right Knee Extension 0   Right Knee Flexion 115  standing, with overpressure into flexion          [facility] Adult PT Treatment/Exercise - 03/26/17 0001      Knee/Hip Exercises: Stretches   Quad Stretch Right;5 reps;60 seconds   Quad Stretch Limitations 4 inch foam roll distal thigh    Gastroc Stretch Right;Left;30 seconds;2 reps    Other Knee/Hip Stretches contract relax with Rt quad x 5 reps x 3 sets, stretching Rt quad in prone.      Knee/Hip Exercises: Aerobic   Recumbent Bike L3-4: 8.5 min   PTA present discussing pt's progress.      Knee/Hip Exercises: Standing   Hip Flexion AAROM;Right;5 reps  foot on back of truck, lunging down to increase knee ROM   Other Standing Knee Exercises slow squat to 13" step with UE support x 2 reps, repeated at 8" step x 3 reps.      Vasopneumatic   Number Minutes Vasopneumatic  15 minutes   Vasopnuematic Location  Knee   Vasopneumatic Pressure Low   Vasopneumatic Temperature  34 deg     Manual Therapy   Manual Therapy Taping;Soft tissue mobilization;Myofascial release;Joint mobilization   Manual therapy comments Rock tape applied in web pattern across knee to aid in edema reduction and I strip applied in zigzag pattern over incision to help with scar mobilization.    Joint Mobilization PA glide Rt hip pt prone   Soft tissue mobilization Rt quad prox/distal.    Myofascial Release Rt quad   Passive ROM overpressure into flexion of Rt knee, ext.                     PT Short Term Goals - 03/01/17 1134      PT SHORT TERM GOAL #1   Title Increase AROM to (-)4 to 100 deg 03/14/17   Time 6   Period Weeks   Status Achieved     PT SHORT TERM GOAL #2   Title Patient to ambulate with good gait pattern with SPC for functional distances of at least 10-15 min 03/14/17   Time 6   Period Weeks   Status Achieved     PT SHORT TERM GOAL #3   Title Independent with initial HEP 03/14/17   Time 6   Period Weeks   Status Achieved           PT Long Term Goals - 03/19/17 0806      PT LONG TERM GOAL #1   Title Increase AROM Rt knee to (-) 2 deg extension to 110 deg flexion 04/25/17   Period Weeks   Status Partially Met     PT LONG TERM GOAL #2   Title 5/5/ strength Rt LE 04/25/17   Time 12   Period Weeks   Status Partially Met     PT LONG TERM GOAL #3   Title  Ambulate without assistive device with good gait pattern for functional distances and at least 20-30 min  04/25/17   Time 12   Period Weeks   Status Achieved     PT LONG TERM GOAL #4   Title Improve FOTO to </= 31% limitation 04/25/17   Time 12   Period Weeks   Status On-going     PT LONG TERM GOAL #5   Title Independent in HEP 04/25/17   Time 12   Period Weeks   Status On-going               Plan - 03/26/17 1715    Clinical Impression Statement Pt's Rt knee flexion ROM slowly improving.  Swelling continues to impede progress.  Pt remains motivated to work diligently to progress towards established goals.    Rehab Potential Good   PT Frequency 2x / week   PT Duration 12 weeks   PT Treatment/Interventions Patient/family education;ADLs/Self Care Home Management;Cryotherapy;Electrical Stimulation;Iontophoresis 4mg/ml Dexamethasone;Moist Heat;Ultrasound;Gait training;Dry needling;Manual techniques;Vasopneumatic Device;Therapeutic activities;Therapeutic exercise   PT Next Visit Plan Work on quad tightness manually, possible US; continue progressive Rt knee ROM/ strengthening.    Consulted and Agree with Plan of Care Patient      Patient will benefit from skilled therapeutic intervention in order to improve the following deficits and impairments:  Postural dysfunction, Improper body mechanics, Pain, Decreased range of motion, Decreased mobility, Decreased strength, Abnormal gait, Decreased activity tolerance  Visit Diagnosis: Acute pain of left knee  Muscle weakness (generalized)  Stiffness of left knee, not elsewhere classified  Other abnormalities of gait and mobility     Problem List Patient Active Problem List   Diagnosis Date Noted  . Left knee osteoarthritis, post right knee arthroplasty 11/02/2015   Jennifer Carlson-Long, PTA 03/26/17 5:16 PM  [facility] [address] [place], [place],  [address] Phone: [phone]   Fax:  [phone]  Name: Terry Greene MRN: [id number] Date of Birth: 05/09/1956   

## 2017-03-28 ENCOUNTER — Ambulatory Visit (INDEPENDENT_AMBULATORY_CARE_PROVIDER_SITE_OTHER): Payer: PRIVATE HEALTH INSURANCE | Admitting: Physical Therapy

## 2017-03-28 DIAGNOSIS — M25662 Stiffness of left knee, not elsewhere classified: Secondary | ICD-10-CM | POA: Diagnosis not present

## 2017-03-28 DIAGNOSIS — M6281 Muscle weakness (generalized): Secondary | ICD-10-CM | POA: Diagnosis not present

## 2017-03-28 DIAGNOSIS — M25562 Pain in left knee: Secondary | ICD-10-CM

## 2017-03-28 NOTE — Therapy (Signed)
[place], [place], [address] Phone: [phone]   Fax:  [phone]  Physical Therapy Treatment  Patient Details  Name: Terry Greene MRN: [id number] Date of Birth: 08/12/1956 Referring Provider: Dr. Jeneen Rinks Mitchell/ Dr. [name]  Encounter Date: 03/28/2017      PT End of Session - 03/28/17 1541    Visit Number 17   Number of Visits 24   Date for PT Re-Evaluation 04/25/17   PT Start Time 8916      No past medical history on file.  No past surgical history on file.  There were no vitals filed for this visit.      Subjective Assessment - 03/28/17 1519    Subjective Pt reports his Rt knee continues to be sore.  Rock tape helped some with pain/swelling.    Currently in Pain? Yes   Pain Score 2    Pain Location Knee   Pain Orientation Right   Pain Descriptors / Indicators Aching   Aggravating Factors  prolonged standing, bending knee past comfortable position   Pain Relieving Factors ice, elevation, medication                         [facility] Adult PT Treatment/Exercise - 03/28/17 0001      Knee/Hip Exercises: Stretches   Sports administrator Right;1 rep;30 seconds   Hip Flexor Stretch Right;Left;2 reps;60 seconds  leg off table, overpressure from PTA, opp leg to chest   Gastroc Stretch Right;Left;3 reps;30 seconds     Knee/Hip Exercises: Aerobic   Recumbent Bike L4: 7 min   PTA present to discuss progress     Manual Therapy   Soft tissue mobilization working through [employer] quad     Myofascial Release Rt quad          Trigger Point Dry Needling - 03/28/17 1559    Consent Given? Yes   Education Handout Provided Yes   Muscles Treated Lower Body Quadriceps  Rt - with estim with DN               PT Education - 03/28/17 1538    Education provided Yes   Education Details HEP, info on trigger dry needling.    Person(s) Educated Patient   Methods  Explanation;Handout;Demonstration          PT Short Term Goals - 03/01/17 1134      PT SHORT TERM GOAL #1   Title Increase AROM to (-)4 to 100 deg 03/14/17   Time 6   Period Weeks   Status Achieved     PT SHORT TERM GOAL #2   Title Patient to ambulate with good gait pattern with SPC for functional distances of at least 10-15 min 03/14/17   Time 6   Period Weeks   Status Achieved     PT SHORT TERM GOAL #3   Title Independent with initial HEP 03/14/17   Time 6   Period Weeks   Status Achieved           PT Long Term Goals - 03/19/17 0806      PT LONG TERM GOAL #1   Title Increase AROM Rt knee to (-) 2 deg extension to 110 deg flexion 04/25/17   Period Weeks   Status Partially Met     PT LONG TERM GOAL #2   Title 5/5/ strength Rt LE 04/25/17   Time 12   Period Weeks   Status Partially Met  PT LONG TERM GOAL #3   Title Ambulate without assistive device with good gait pattern for functional distances and at least 20-30 min  04/25/17   Time 12   Period Weeks   Status Achieved     PT LONG TERM GOAL #4   Title Improve FOTO to </= 31% limitation 04/25/17   Time 12   Period Weeks   Status On-going     PT LONG TERM GOAL #5   Title Independent in HEP 04/25/17   Time 12   Period Weeks   Status On-going             Patient will benefit from skilled therapeutic intervention in order to improve the following deficits and impairments:     Visit Diagnosis: Stiffness of left knee, not elsewhere classified  Acute pain of left knee  Muscle weakness (generalized)     Problem List Patient Active Problem List   Diagnosis Date Noted  . Left knee osteoarthritis, post right knee arthroplasty 11/02/2015    Terry Greene Nilda Simmer PT, MPH  03/28/2017, 4:05 PM  [facility] [place], [place], [address] Phone: [phone]   Fax:  [phone]  Name: Terry Greene MRN: [id number] Date of Birth:  1956-03-27

## 2017-03-28 NOTE — Therapy (Signed)
[place], [place], [address] Phone: [phone]   Fax:  [phone]  Physical Therapy Treatment  Patient Details  Name: Terry Greene MRN: [id number] Date of Birth: [birth year] Referring Provider: Dr. Jeneen Rinks Mitchell/ Dr. [name]  Encounter Date: 03/28/2017      PT End of Session - 03/28/17 1541    Visit Number 17   Number of Visits 24   Date for PT Re-Evaluation 04/25/17   PT Start Time 1508   PT Stop Time 1611   PT Time Calculation (min) 63 min   Activity Tolerance Patient tolerated treatment well   Behavior During Therapy [facility] for tasks assessed/performed      No past medical history on file.  No past surgical history on file.  There were no vitals filed for this visit.      Subjective Assessment - 03/28/17 1519    Subjective Pt reports his Rt knee continues to be sore.  Rock tape helped some with pain/swelling.    Currently in Pain? Yes   Pain Score 2    Pain Location Knee   Pain Orientation Right   Pain Descriptors / Indicators Aching   Aggravating Factors  prolonged standing, bending knee past comfortable position   Pain Relieving Factors ice, elevation, medication          [facility] Adult PT Treatment/Exercise - 03/28/17 0001      Knee/Hip Exercises: Stretches   Sports administrator Right;30 seconds;3 reps   Hip Flexor Stretch Right;Left;2 reps;60 seconds  leg off table, overpressure from PTA, opp leg to chest   Gastroc Stretch Right;Left;3 reps;30 seconds     Knee/Hip Exercises: Aerobic   Recumbent Bike L4: 7 min   PTA present to discuss progress     Modalities   Modalities Cryotherapy;Electrical Stimulation;Moist Heat;Ultrasound     Moist Heat Therapy   Number Minutes Moist Heat 15 Minutes   Moist Heat Location --  Rt quad     Cryotherapy   Number Minutes Cryotherapy 15 Minutes   Cryotherapy Location Knee  Rt   Type of Cryotherapy Ice pack     Electrical  Stimulation   Electrical Stimulation Location Rt knee    Electrical Stimulation Action ion repelling    Electrical Stimulation Parameters to tolerance    Electrical Stimulation Goals Edema;Pain     Manual Therapy   Manual therapy comments Rock tape removed.    Soft tissue mobilization working through [employer] quad     Myofascial Release Rt quad          Trigger Point Dry Needling - 03/28/17 1559    Consent Given? Yes   Education Handout Provided Yes   Muscles Treated Lower Body Quadriceps  Rt - with estim with DN               PT Education - 03/28/17 1538    Education provided Yes   Education Details HEP, info on trigger dry needling.    Person(s) Educated Patient   Methods Explanation;Handout;Demonstration          PT Short Term Goals - 03/01/17 1134      PT SHORT TERM GOAL #1   Title Increase AROM to (-)4 to 100 deg 03/14/17   Time 6   Period Weeks   Status Achieved     PT SHORT TERM GOAL #2   Title Patient to ambulate with good gait pattern with SPC for functional distances of at least 10-15 min 03/14/17  Time 6   Period Weeks   Status Achieved     PT SHORT TERM GOAL #3   Title Independent with initial HEP 03/14/17   Time 6   Period Weeks   Status Achieved           PT Long Term Goals - 03/19/17 0806      PT LONG TERM GOAL #1   Title Increase AROM Rt knee to (-) 2 deg extension to 110 deg flexion 04/25/17   Period Weeks   Status Partially Met     PT LONG TERM GOAL #2   Title 5/5/ strength Rt LE 04/25/17   Time 12   Period Weeks   Status Partially Met     PT LONG TERM GOAL #3   Title Ambulate without assistive device with good gait pattern for functional distances and at least 20-30 min  04/25/17   Time 12   Period Weeks   Status Achieved     PT LONG TERM GOAL #4   Title Improve FOTO to </= 31% limitation 04/25/17   Time 12   Period Weeks   Status On-going     PT LONG TERM GOAL #5   Title Independent in HEP 04/25/17   Time 12   Period  Weeks   Status On-going               Plan - 03/28/17 1655    Clinical Impression Statement Pt tolerated initial session of dry needling to Rt quad (provided by Gillermo Murdoch, PT).  Rt knee flexion ROM remained 115 deg.  Progressing gradually.    Rehab Potential Good   PT Frequency 2x / week   PT Duration 12 weeks   PT Treatment/Interventions Patient/family education;ADLs/Self Care Home Management;Cryotherapy;Electrical Stimulation;Iontophoresis 44m/ml Dexamethasone;Moist Heat;Ultrasound;Gait training;Dry needling;Manual techniques;Vasopneumatic Device;Therapeutic activities;Therapeutic exercise   PT Next Visit Plan Assess response to DN.  Continue manual therapy/DN/ ROM for Rt knee.    Consulted and Agree with Plan of Care Patient      Patient will benefit from skilled therapeutic intervention in order to improve the following deficits and impairments:  Postural dysfunction, Improper body mechanics, Pain, Decreased range of motion, Decreased mobility, Decreased strength, Abnormal gait, Decreased activity tolerance  Visit Diagnosis: Stiffness of left knee, not elsewhere classified  Acute pain of left knee  Muscle weakness (generalized)     Problem List Patient Active Problem List   Diagnosis Date Noted  . Left knee osteoarthritis, post right knee arthroplasty 11/02/2015   JKerin Perna PTA 03/28/17 4:57 PM  [email or web address] [address]Phone: [phone]  Fax:  [phone] Name: MRayfield BeemMRN: [id number] of Birth: 427-Aug-1957

## 2017-03-28 NOTE — Patient Instructions (Signed)
Hip Flexors (Supine)      Lie with both legs bent over edge of firm surface. To stretch left hip, bring opposite knee to chest. Apply downward pressure to leg hanging over edge. Do not allow hips to roll up. Do not let knees change position. Hold _45___ seconds. Repeat __2-3__ times. Do __2__ sessions per day. CAUTION: Stretch should be gentle, steady and slow.     Trigger Point Dry Needling  . What is Trigger Point Dry Needling (DN)? o DN is a physical therapy technique used to treat muscle pain and dysfunction. Specifically, DN helps deactivate muscle trigger points (muscle knots).  o A thin filiform needle is used to penetrate the skin and stimulate the underlying trigger point. The goal is for a local twitch response (LTR) to occur and for the trigger point to relax. No medication of any kind is injected during the procedure.   . What Does Trigger Point Dry Needling Feel Like?  o The procedure feels different for each individual patient. Some patients report that they do not actually feel the needle enter the skin and overall the process is not painful. Very mild bleeding may occur. However, many patients feel a deep cramping in the muscle in which the needle was inserted. This is the local twitch response.   Marland Kitchen. How Will I feel after the treatment? o Soreness is normal, and the onset of soreness may not occur for a few hours. Typically this soreness does not last longer than two days.  o Bruising is uncommon, however; ice can be used to decrease any possible bruising.  o In rare cases feeling tired or nauseous after the treatment is normal. In addition, your symptoms may get worse before they get better, this period will typically not last longer than 24 hours.   . What Can I do After My Treatment? o Increase your hydration by drinking more water for the next 24 hours. o You may place ice or heat on the areas treated that have become sore, however, do not use heat on inflamed or  bruised areas. Heat often brings more relief post needling. o You can continue your regular activities, but vigorous activity is not recommended initially after the treatment for 24 hours. o DN is best combined with other physical therapy such as strengthening, stretching, and other therapies.    [facility] [address] [place], [place] [address]  [phone] (office) [phone] (fax)

## 2017-04-03 ENCOUNTER — Ambulatory Visit (INDEPENDENT_AMBULATORY_CARE_PROVIDER_SITE_OTHER): Payer: PRIVATE HEALTH INSURANCE | Admitting: Physical Therapy

## 2017-04-03 DIAGNOSIS — M25662 Stiffness of left knee, not elsewhere classified: Secondary | ICD-10-CM

## 2017-04-03 DIAGNOSIS — M6281 Muscle weakness (generalized): Secondary | ICD-10-CM | POA: Diagnosis not present

## 2017-04-03 DIAGNOSIS — R2689 Other abnormalities of gait and mobility: Secondary | ICD-10-CM | POA: Diagnosis not present

## 2017-04-03 DIAGNOSIS — M25562 Pain in left knee: Secondary | ICD-10-CM | POA: Diagnosis not present

## 2017-04-03 NOTE — Therapy (Signed)
[place], [place], [address] Phone: [phone]   Fax:  [phone]  Physical Therapy Treatment  Patient Details  Name: Blayke Cordrey MRN: [id number] Date of Birth: [birth year] Referring Provider: Dr. Jeneen Rinks Mitchell/ Dr. [name]  Encounter Date: 04/03/2017      PT End of Session - 04/03/17 0941    Visit Number 18   Number of Visits 24   Date for PT Re-Evaluation 04/25/17   PT Start Time 0852  pt arrived late   PT Stop Time 0948   PT Time Calculation (min) 56 min   Activity Tolerance Patient tolerated treatment well   Behavior During Therapy [facility] for tasks assessed/performed      No past medical history on file.  No past surgical history on file.  There were no vitals filed for this visit.      Subjective Assessment - 04/03/17 0857    Subjective Pt reports he feels like his Rt quad is looser since DN last session.  He has had increased pain along Rt knee joint line and patellar tendon since Friday (4 days ago).  He has been full duty at work since last week.    Currently in Pain? Yes   Pain Score 4   took 2 pain pills 3 hours ago.    Pain Location Knee   Pain Orientation Right   Pain Descriptors / Indicators Aching   Aggravating Factors  bending past comfortable position   Pain Relieving Factors ice, elevation, medication            [facility] PT Assessment - 04/03/17 0001      Assessment   Medical Diagnosis Rt TKA   Onset Date/Surgical Date 01/19/17   Hand Dominance Right   Next MD Visit PRN    Prior Therapy 2 days in hospital     AROM   Right Knee Extension 0   Right Knee Flexion 112          [facility] Adult PT Treatment/Exercise - 04/03/17 0001      Knee/Hip Exercises: Stretches   Sports administrator Right;30 seconds;5 reps   Gastroc Stretch Right;Left;3 reps;30 seconds     Knee/Hip Exercises: Aerobic   Recumbent Bike L4: 4.5 min      Knee/Hip Exercises: Standing   Other  Standing Knee Exercises Rt lunge on step to increased Rt knee flexion x 15 sec hold x 6 reps.      Knee/Hip Exercises: Supine   Heel Slides AROM;Right;1 set;5 reps  for measurement     Modalities   Modalities Passenger transport manager Location Rt knee    Building services engineer Parameters to tolerance    Electrical Stimulation Goals Edema;Pain     Manual Therapy   Manual Therapy Soft tissue mobilization;Myofascial release;Taping   Manual therapy comments retrograde massage to Rt knee to assist in edema reduction.    Joint Mobilization PA glide Rt hip pt prone   Soft tissue mobilization working through Rt quad    Myofascial Release Rt quad   Kinesiotex Edema     Kinesiotix   Edema Rock tape applied to Rt ant knee in shape of 1/2 horse shoe around medial and lateral patella to decompress tissue, decrease pain, decrease edema.                    PT Short Term Goals - 03/01/17 1134  PT SHORT TERM GOAL #1   Title Increase AROM to (-)4 to 100 deg 03/14/17   Time 6   Period Weeks   Status Achieved     PT SHORT TERM GOAL #2   Title Patient to ambulate with good gait pattern with SPC for functional distances of at least 10-15 min 03/14/17   Time 6   Period Weeks   Status Achieved     PT SHORT TERM GOAL #3   Title Independent with initial HEP 03/14/17   Time 6   Period Weeks   Status Achieved           PT Long Term Goals - 03/19/17 0806      PT LONG TERM GOAL #1   Title Increase AROM Rt knee to (-) 2 deg extension to 110 deg flexion 04/25/17   Period Weeks   Status Partially Met     PT LONG TERM GOAL #2   Title 5/5/ strength Rt LE 04/25/17   Time 12   Period Weeks   Status Partially Met     PT LONG TERM GOAL #3   Title Ambulate without assistive device with good gait pattern for functional distances and at least 20-30 min  04/25/17   Time 12    Period Weeks   Status Achieved     PT LONG TERM GOAL #4   Title Improve FOTO to </= 31% limitation 04/25/17   Time 12   Period Weeks   Status On-going     PT LONG TERM GOAL #5   Title Independent in HEP 04/25/17   Time 12   Period Weeks   Status On-going               Plan - 04/03/17 0942    Clinical Impression Statement Pt demonstrated slight decrease in Rt knee flexion ROM, also notable pitting edema along joint line and at tibial plateau.  His Rt quad has less palpable tightness with manual therapy.  Pt reported decreased pain at end of session after estim/vaso. Pt making gradual gains toward remaining goals.    Rehab Potential Good   PT Frequency 2x / week   PT Duration 12 weeks   PT Treatment/Interventions Patient/family education;ADLs/Self Care Home Management;Cryotherapy;Electrical Stimulation;Iontophoresis 24m/ml Dexamethasone;Moist Heat;Ultrasound;Gait training;Dry needling;Manual techniques;Vasopneumatic Device;Therapeutic activities;Therapeutic exercise   PT Next Visit Plan FOTO, assess goals.  Pt requests to d/c next visit.    Consulted and Agree with Plan of Care Patient      Patient will benefit from skilled therapeutic intervention in order to improve the following deficits and impairments:  Postural dysfunction, Improper body mechanics, Pain, Decreased range of motion, Decreased mobility, Decreased strength, Abnormal gait, Decreased activity tolerance  Visit Diagnosis: Stiffness of left knee, not elsewhere classified  Acute pain of left knee  Muscle weakness (generalized)  Other abnormalities of gait and mobility     Problem List Patient Active Problem List   Diagnosis Date Noted  . Left knee osteoarthritis, post right knee arthroplasty 11/02/2015   JKerin Perna PTA 04/03/17 1:28 PM  [id number] [address]Phone: [phone]  Fax:   [phone] Name: MNoemi IshmaelMRN: [id number] of Birth: 428-Mar-1957

## 2017-04-04 ENCOUNTER — Other Ambulatory Visit: Payer: Self-pay | Admitting: Sports Medicine

## 2017-04-04 DIAGNOSIS — M170 Bilateral primary osteoarthritis of knee: Secondary | ICD-10-CM

## 2017-04-05 ENCOUNTER — Encounter: Payer: Self-pay | Admitting: Physical Therapy

## 2017-04-05 ENCOUNTER — Ambulatory Visit (INDEPENDENT_AMBULATORY_CARE_PROVIDER_SITE_OTHER): Payer: PRIVATE HEALTH INSURANCE | Admitting: Physical Therapy

## 2017-04-05 DIAGNOSIS — R2689 Other abnormalities of gait and mobility: Secondary | ICD-10-CM | POA: Diagnosis not present

## 2017-04-05 DIAGNOSIS — M25562 Pain in left knee: Secondary | ICD-10-CM

## 2017-04-05 DIAGNOSIS — M25662 Stiffness of left knee, not elsewhere classified: Secondary | ICD-10-CM | POA: Diagnosis not present

## 2017-04-05 DIAGNOSIS — M6281 Muscle weakness (generalized): Secondary | ICD-10-CM | POA: Diagnosis not present

## 2017-04-05 NOTE — Therapy (Signed)
[place], [place], [address] Phone: [phone]   Fax:  [phone]  Physical Therapy Treatment  Patient Details  Name: Terry Greene MRN: [id number] Date of Birth: [birth year] Referring Provider: Dr Jeneen Rinks Mitchell/Dr [name]  Encounter Date: 04/05/2017      PT End of Session - 04/05/17 0939    Visit Number 19   Number of Visits 24   Date for PT Re-Evaluation 04/25/17   PT Start Time 0934   PT Stop Time 1030   PT Time Calculation (min) 56 min      History reviewed. No pertinent past medical history.  History reviewed. No pertinent surgical history.  There were no vitals filed for this visit.      Subjective Assessment - 04/05/17 0940    Subjective Pt is ready to be discharged, will have a new set of PT visits starting in July and wants to save them for when he had the other knee replaced.    Currently in Pain? Yes   Pain Score 4    Pain Location Knee   Pain Orientation Right            [facility] PT Assessment - 04/05/17 0001      Assessment   Medical Diagnosis Rt TKA   Referring Provider Dr Jeneen Rinks Mitchell/Dr [name]   Onset Date/Surgical Date 01/19/17   Hand Dominance Right   Next MD Visit PRN      Observation/Other Assessments   Focus on Therapeutic Outcomes (FOTO)  30% limited     AROM   Right Knee Extension 0   Right Knee Flexion 113     Strength   Right Hip Flexion 5/5   Right Hip Extension 5/5   Left Hip ABduction 5/5   Right Knee Extension 5/5   Left Knee Flexion 5/5                     [facility] Adult PT Treatment/Exercise - 04/05/17 0001      Self-Care   Self-Care Other Self-Care Comments   Other Self-Care Comments  discussed wt progression at the gym, once 3x12 is easy  add wt. also recommend during his day to find something to put his Rt foot on to work on some more bending      Knee/Hip Exercises: Stretches   Press photographer Both;2  reps;30 seconds     Knee/Hip Exercises: Aerobic   Recumbent Bike L4: 5 min      Knee/Hip Exercises: Standing   Step Down Step Height: 6"  Lt heel taps, VC for form   SLS Rt with FWD reach to floor 2x10     Knee/Hip Exercises: Supine   Heel Slides AAROM;Right;5 reps     Modalities   Modalities IT sales professional Parameters to tolerance   Electrical Stimulation Goals Edema;Pain     Vasopneumatic   Number Minutes Vasopneumatic  15 minutes   Vasopnuematic Location  Knee   Vasopneumatic Pressure Low   Vasopneumatic Temperature  3*                PT Education - 04/05/17 1004    Education provided Yes   Education Details HEP   Person(s) Educated Patient   Methods Explanation;Demonstration;Handout   Comprehension Returned demonstration;Verbalized understanding          PT  Short Term Goals - 03/01/17 1134      PT SHORT TERM GOAL #1   Title Increase AROM to (-)4 to 100 deg 03/14/17   Time 6   Period Weeks   Status Achieved     PT SHORT TERM GOAL #2   Title Patient to ambulate with good gait pattern with SPC for functional distances of at least 10-15 min 03/14/17   Time 6   Period Weeks   Status Achieved     PT SHORT TERM GOAL #3   Title Independent with initial HEP 03/14/17   Time 6   Period Weeks   Status Achieved           PT Long Term Goals - 04/05/17 0941      PT LONG TERM GOAL #1   Title Increase AROM Rt knee to (-) 2 deg extension to 110 deg flexion 04/25/17   Status Achieved     PT LONG TERM GOAL #2   Title 5/5/ strength Rt LE 04/25/17   Status Achieved     PT LONG TERM GOAL #3   Title Ambulate without assistive device with good gait pattern for functional distances and at least 20-30 min  04/25/17   Status Achieved     PT LONG TERM GOAL #4   Title Improve FOTO to </= 31% limitation  04/25/17   Status Achieved  30% limited     PT LONG TERM GOAL #5   Title Independent in HEP 04/25/17   Status Achieved             Patient will benefit from skilled therapeutic intervention in order to improve the following deficits and impairments:     Visit Diagnosis: Stiffness of left knee, not elsewhere classified  Acute pain of left knee  Muscle weakness (generalized)  Other abnormalities of gait and mobility     Problem List Patient Active Problem List   Diagnosis Date Noted  . Left knee osteoarthritis, post right knee arthroplasty 11/02/2015    [facility] 04/05/2017, 11:26 AM  [facility] [place], [place], [address] Phone: [phone]   Fax:  [phone]  Name: Terry Greene MRN: [id number] Date of Birth: 1956/07/15  PHYSICAL THERAPY DISCHARGE SUMMARY  Visits from Start of Care: 19  Current functional level related to goals / functional outcomes:see above   Remaining deficits: Edema present   Education / Equipment: HEP Plan: Patient agrees to discharge.  Patient goals were met. Patient is being discharged due to being pleased with the current functional level.  ?????    Jeral Pinch, PT 04/05/17 11:27 AM

## 2017-04-05 NOTE — Patient Instructions (Addendum)
Balance / Reach    Stand on right foot, Holding __0__ pound weight in other hand. Bend knee, lowering body, and reach across. Hold __1__ seconds. Relax. Repeat _10___ times per set. Do __2-3__ sets per session. Do _1___ sessions per day.  Proprioception, Coordination, Quad Strength: Retro Step-Up    Keep right foot on step, use railings for assist as needed. Slowly bend right knee to lower left heel to floor. Tap and return up.  Keep right knee behind foot and don't drop left hip to lower the foot. Use __6-8__ inch step. Repeat __10__ times 2-3 sets. Do __1__ sessions per day.

## 2017-04-18 ENCOUNTER — Other Ambulatory Visit: Payer: Self-pay | Admitting: Sports Medicine

## 2017-05-03 ENCOUNTER — Other Ambulatory Visit: Payer: Self-pay | Admitting: Sports Medicine

## 2017-05-03 DIAGNOSIS — M170 Bilateral primary osteoarthritis of knee: Secondary | ICD-10-CM

## 2017-05-05 ENCOUNTER — Other Ambulatory Visit: Payer: Self-pay | Admitting: Sports Medicine

## 2017-05-05 DIAGNOSIS — M170 Bilateral primary osteoarthritis of knee: Secondary | ICD-10-CM

## 2017-05-11 ENCOUNTER — Ambulatory Visit (INDEPENDENT_AMBULATORY_CARE_PROVIDER_SITE_OTHER): Payer: PRIVATE HEALTH INSURANCE | Admitting: Sports Medicine

## 2017-05-11 DIAGNOSIS — M170 Bilateral primary osteoarthritis of knee: Secondary | ICD-10-CM | POA: Diagnosis not present

## 2017-05-11 NOTE — Assessment & Plan Note (Signed)
Four months post last injection by his orthopedist, repeat left knee injection today, he plans for left knee arthroplasty sometime early next year.

## 2017-05-11 NOTE — Progress Notes (Signed)
  Subjective:    CC: Left knee pain  HPI: This is a pleasant 61 year old male with known left knee osteoarthritis, he is post right knee arthroplasty, I injected his knee back in December 2017, he had his arthroplasty sometime in April or May, had an injection that as well in his left knee, now having a recurrence of pain, moderate, persistent, localized at the medial joint line without radiation. Desires repeat injection today.  Past medical history:  Negative.  See flowsheet/record as well for more information.  Surgical history: Negative.  See flowsheet/record as well for more information.  Family history: Negative.  See flowsheet/record as well for more information.  Social history: Negative.  See flowsheet/record as well for more information.  Allergies, and medications have been entered into the medical record, reviewed, and no changes needed.   Review of Systems: No fevers, chills, night sweats, weight loss, chest pain, or shortness of breath.   Objective:    General: Well Developed, well nourished, and in no acute distress.  Neuro: Alert and oriented x3, extra-ocular muscles intact, sensation grossly intact.  HEENT: Normocephalic, atraumatic, pupils equal round reactive to light, neck supple, no masses, no lymphadenopathy, thyroid nonpalpable.  Skin: Warm and dry, no rashes. Cardiac: Regular rate and rhythm, no murmurs rubs or gallops, no lower extremity edema.  Respiratory: Clear to auscultation bilaterally. Not using accessory muscles, speaking in full sentences. Left Knee: Visibly swollen with a palpable fluid wave, effusion, and tenderness at the medial joint line. ROM normal in flexion and extension and lower leg rotation. Ligaments with solid consistent endpoints including ACL, PCL, LCL, MCL. Negative Mcmurray's and provocative meniscal tests. Non painful patellar compression. Patellar and quadriceps tendons unremarkable. Hamstring and quadriceps strength is  normal.  Procedure: Real-time Ultrasound Guided aspiration/injection of left knee Device: GE Logiq E  Verbal informed consent obtained.  Time-out conducted.  Noted no overlying erythema, induration, or other signs of local infection.  Skin prepped in a sterile fashion.  Local anesthesia: Topical Ethyl chloride.  With sterile technique and under real time ultrasound guidance:  Using 18-gauge needle aspirated 22 mL straw-colored fluid, syringe switched and 1 mL Kenalog 40, 2 mL lidocaine, 2 mL bupivacaine injected easily. Completed without difficulty  Pain immediately resolved suggesting accurate placement of the medication.  Advised to call if fevers/chills, erythema, induration, drainage, or persistent bleeding.  Images permanently stored and available for review in the ultrasound unit.  Impression: Technically successful ultrasound guided injection.  Impression and Recommendations:    Left knee osteoarthritis, post right knee arthroplasty Four months post last injection by his orthopedist, repeat left knee injection today, he plans for left knee arthroplasty sometime early next year.

## 2017-05-23 ENCOUNTER — Other Ambulatory Visit: Payer: Self-pay | Admitting: Sports Medicine

## 2017-05-23 DIAGNOSIS — M170 Bilateral primary osteoarthritis of knee: Secondary | ICD-10-CM

## 2017-06-12 ENCOUNTER — Other Ambulatory Visit: Payer: Self-pay | Admitting: Sports Medicine

## 2017-06-12 DIAGNOSIS — M170 Bilateral primary osteoarthritis of knee: Secondary | ICD-10-CM

## 2017-07-16 ENCOUNTER — Other Ambulatory Visit: Payer: Self-pay | Admitting: Sports Medicine

## 2017-07-16 DIAGNOSIS — M170 Bilateral primary osteoarthritis of knee: Secondary | ICD-10-CM

## 2017-07-24 ENCOUNTER — Ambulatory Visit (INDEPENDENT_AMBULATORY_CARE_PROVIDER_SITE_OTHER): Payer: PRIVATE HEALTH INSURANCE | Admitting: Sports Medicine

## 2017-07-24 DIAGNOSIS — M170 Bilateral primary osteoarthritis of knee: Secondary | ICD-10-CM | POA: Diagnosis not present

## 2017-07-24 MED ORDER — TRAMADOL HCL 50 MG PO TABS: 100.0000 mg | ORAL_TABLET | Freq: Three times a day (TID) | ORAL | 0 refills | Status: DC | PRN

## 2017-07-24 NOTE — Assessment & Plan Note (Signed)
Aspiration of the left knee, 20 mL. Ulyses does he does need a knee replacement. Refilling tramadol, three-month supply.

## 2017-07-24 NOTE — Progress Notes (Signed)
  Subjective:    CC:  Left knee swelling  HPI: This is a pleasant 61 year old male, he is post right total knee arthroplasty doing well, has known severe and end-stage left knee osteoarthritis, occasionally he gets effusions and desires aspiration. Symptoms are moderate, persistent, localized without radiation, no trauma, no constitutional symptoms.  Past medical history:  Negative.  See flowsheet/record as well for more information.  Surgical history: Negative.  See flowsheet/record as well for more information.  Family history: Negative.  See flowsheet/record as well for more information.  Social history: Negative.  See flowsheet/record as well for more information.  Allergies, and medications have been entered into the medical record, reviewed, and no changes needed.   Review of Systems: No fevers, chills, night sweats, weight loss, chest pain, or shortness of breath.   Objective:    General: Well Developed, well nourished, and in no acute distress.  Neuro: Alert and oriented x3, extra-ocular muscles intact, sensation grossly intact.  HEENT: Normocephalic, atraumatic, pupils equal round reactive to light, neck supple, no masses, no lymphadenopathy, thyroid nonpalpable.  Skin: Warm and dry, no rashes. Cardiac: Regular rate and rhythm, no murmurs rubs or gallops, no lower extremity edema.  Respiratory: Clear to auscultation bilaterally. Not using accessory muscles, speaking in full sentences. Left Knee: Normal to inspection with no erythema or effusion or obvious bony abnormalities. Mild effusion, tenderness at the medial joint line. ROM normal in flexion and extension and lower leg rotation. Ligaments with solid consistent endpoints including ACL, PCL, LCL, MCL. Negative Mcmurray's and provocative meniscal tests. Non painful patellar compression. Patellar and quadriceps tendons unremarkable. Hamstring and quadriceps strength is normal.  Procedure: Real-time Ultrasound Guided  aspiration of the left knee Device: GE Logiq E  Verbal informed consent obtained.  Time-out conducted.  Noted no overlying erythema, induration, or other signs of local infection.  Skin prepped in a sterile fashion.  Local anesthesia: Topical Ethyl chloride.  With sterile technique and under real time ultrasound guidance:  Arthrocentesis with 18-gauge needle provided 20 mL straw-colored fluid. Completed without difficulty  Pain immediately resolved suggesting accurate placement of the medication.  Advised to call if fevers/chills, erythema, induration, drainage, or persistent bleeding.  Images permanently stored and available for review in the ultrasound unit.  Impression: Technically successful ultrasound guided injection.  Impression and Recommendations:    Left knee osteoarthritis, post right knee arthroplasty Aspiration of the left knee, 20 mL. Cass does he does need a knee replacement. Refilling tramadol, three-month supply. ___________________________________________ Ihor Austin. Benjamin Stain, M.D., ABFM., CAQSM. Primary Care and Sports Medicine [facility]  Adjunct Instructor of Family Medicine  [employer]

## 2017-08-22 ENCOUNTER — Ambulatory Visit: Payer: PRIVATE HEALTH INSURANCE | Admitting: Sports Medicine

## 2017-08-22 DIAGNOSIS — M170 Bilateral primary osteoarthritis of knee: Secondary | ICD-10-CM

## 2017-08-22 NOTE — Assessment & Plan Note (Signed)
Aspiration was performed last month, today we are going to do aspiration and injection. He does understand that he needs to proceed with total knee arthroplasty on the left, right side is doing well after arthroplasty.  He does have some stiffness since his knee arthroplasty on the right, there is a mild effusion, if he does not get resolution of the stiffness from the systemic effect from his left knee injection we will do an aspiration, crystal analysis and cultures as well as injection on the right replaced knee.

## 2017-08-22 NOTE — Progress Notes (Signed)
  Subjective:    CC: Left knee pain  HPI: I aspirated this pleasant 61 year old male his knee about a month ago, he returns with increasing pain, desires aspiration and injection, pain is moderate, persistent, localized at the medial joint line without radiation, he is post right knee arthroplasty and doing extremely well.  Past medical history:  Negative.  See flowsheet/record as well for more information.  Surgical history: Negative.  See flowsheet/record as well for more information.  Family history: Negative.  See flowsheet/record as well for more information.  Social history: Negative.  See flowsheet/record as well for more information.  Allergies, and medications have been entered into the medical record, reviewed, and no changes needed.   Review of Systems: No fevers, chills, night sweats, weight loss, chest pain, or shortness of breath.   Objective:    General: Well Developed, well nourished, and in no acute distress.  Neuro: Alert and oriented x3, extra-ocular muscles intact, sensation grossly intact.  HEENT: Normocephalic, atraumatic, pupils equal round reactive to light, neck supple, no masses, no lymphadenopathy, thyroid nonpalpable.  Skin: Warm and dry, no rashes. Cardiac: Regular rate and rhythm, no murmurs rubs or gallops, no lower extremity edema.  Respiratory: Clear to auscultation bilaterally. Not using accessory muscles, speaking in full sentences. Left knee: Expected varus deformity, tender at the medial joint line with moderate effusion. ROM normal in flexion and extension and lower leg rotation. Ligaments with solid consistent endpoints including ACL, PCL, LCL, MCL. Negative Mcmurray's and provocative meniscal tests. Non painful patellar compression. Patellar and quadriceps tendons unremarkable. Hamstring and quadriceps strength is normal.  Procedure: Real-time Ultrasound Guided aspiration/injection of left knee Device: GE Logiq E  Verbal informed consent  obtained.  Time-out conducted.  Noted no overlying erythema, induration, or other signs of local infection.  Skin prepped in a sterile fashion.  Local anesthesia: Topical Ethyl chloride.  With sterile technique and under real time ultrasound guidance: Using an 18-gauge needle I advanced into the suprapatellar recess, aspirated 24 cc of straw-colored fluid, syringe switched in 1 cc kenalog 40, 2 cc lidocaine, 2 cc bupivacaine injected easily. Completed without difficulty  Pain immediately resolved suggesting accurate placement of the medication.  Advised to call if fevers/chills, erythema, induration, drainage, or persistent bleeding.  Images permanently stored and available for review in the ultrasound unit.  Impression: Technically successful ultrasound guided injection.  Impression and Recommendations:    Left knee osteoarthritis, post right knee arthroplasty Aspiration was performed last month, today we are going to do aspiration and injection. He does understand that he needs to proceed with total knee arthroplasty on the left, right side is doing well after arthroplasty.  He does have some stiffness since his knee arthroplasty on the right, there is a mild effusion, if he does not get resolution of the stiffness from the systemic effect from his left knee injection we will do an aspiration, crystal analysis and cultures as well as injection on the right replaced knee.  ___________________________________________ Ihor Austinhomas J. Benjamin Stainhekkekandam, M.D., ABFM., CAQSM. Primary Care and Sports Medicine [facility]  Adjunct Instructor of Family Medicine  [employer]

## 2017-09-03 ENCOUNTER — Other Ambulatory Visit: Payer: Self-pay

## 2017-09-03 MED ORDER — MELOXICAM 15 MG PO TABS
15.0000 mg | ORAL_TABLET | Freq: Every day | ORAL | 1 refills | Status: DC | PRN
Start: 2017-09-03 — End: 2018-02-20

## 2017-10-23 ENCOUNTER — Ambulatory Visit (INDEPENDENT_AMBULATORY_CARE_PROVIDER_SITE_OTHER): Payer: PRIVATE HEALTH INSURANCE

## 2017-10-23 ENCOUNTER — Ambulatory Visit: Payer: PRIVATE HEALTH INSURANCE | Admitting: Sports Medicine

## 2017-10-23 DIAGNOSIS — M5416 Radiculopathy, lumbar region: Secondary | ICD-10-CM

## 2017-10-23 DIAGNOSIS — M170 Bilateral primary osteoarthritis of knee: Secondary | ICD-10-CM

## 2017-10-23 DIAGNOSIS — M25461 Effusion, right knee: Secondary | ICD-10-CM | POA: Diagnosis not present

## 2017-10-23 DIAGNOSIS — Z96651 Presence of right artificial knee joint: Secondary | ICD-10-CM

## 2017-10-23 DIAGNOSIS — M25462 Effusion, left knee: Secondary | ICD-10-CM | POA: Diagnosis not present

## 2017-10-23 DIAGNOSIS — M1712 Unilateral primary osteoarthritis, left knee: Secondary | ICD-10-CM

## 2017-10-23 NOTE — Progress Notes (Signed)
Subjective:    I'm seeing this patient as a consultation for: Dr. Roxy MannsAlexei Radiontchenko  CC: Knee pain, back pain  HPI: This is a very pleasant 62 year old male, he is post right knee arthroplasty, he does have known left knee osteoarthritis, we did an injection 2 months ago, aspiration as well.  He gets frequent effusions that respond well to occasional arthrocentesis.  Having recurrence of swelling, pain, moderate, persistent without radiation, no mechanical symptoms, no trauma.  No constitutional symptoms.  Low back pain: Moderate, persistent, with radiation occasionally down the left leg with numbness and tingling to the foot.  No bowel or bladder dysfunction, saddle numbness, constitutional symptoms.  I reviewed the past medical history, family history, social history, surgical history, and allergies today and no changes were needed.  Please see the problem list section below in epic for further details.  Past Medical History: No past medical history on file. Past Surgical History: No past surgical history on file. Social History: Social History   Socioeconomic History  . Marital status: Divorced    Spouse name: Not on file  . Number of children: Not on file  . Years of education: Not on file  . Highest education level: Not on file  Social Needs  . Financial resource strain: Not on file  . Food insecurity - worry: Not on file  . Food insecurity - inability: Not on file  . Transportation needs - medical: Not on file  . Transportation needs - non-medical: Not on file  Occupational History  . Not on file  Tobacco Use  . Smoking status: Not on file  Substance and Sexual Activity  . Alcohol use: Not on file  . Drug use: Not on file  . Sexual activity: Not on file  Other Topics Concern  . Not on file  Social History Narrative  . Not on file   Family History: No family history on file. Allergies: No Known Allergies Medications: See med rec.  Review of Systems: No  headache, visual changes, nausea, vomiting, diarrhea, constipation, dizziness, abdominal pain, skin rash, fevers, chills, night sweats, weight loss, swollen lymph nodes, body aches, joint swelling, muscle aches, chest pain, shortness of breath, mood changes, visual or auditory hallucinations.   Objective:   General: Well Developed, well nourished, and in no acute distress.  Neuro:  Extra-ocular muscles intact, able to move all 4 extremities, sensation grossly intact.  Deep tendon reflexes tested were normal. Psych: Alert and oriented, mood congruent with affect. ENT:  Ears and nose appear unremarkable.  Hearing grossly normal. Neck: Unremarkable overall appearance, trachea midline.  No visible thyroid enlargement. Eyes: Conjunctivae and lids appear unremarkable.  Pupils equal and round. Skin: Warm and dry, no rashes noted.  Cardiovascular: Pulses palpable, no extremity edema. Left Knee: Normal to inspection with no erythema or effusion or obvious bony abnormalities. Palpation normal with no warmth or joint line tenderness or patellar tenderness or condyle tenderness. ROM normal in flexion and extension and lower leg rotation. Ligaments with solid consistent endpoints including ACL, PCL, LCL, MCL. Negative Mcmurray's and provocative meniscal tests. Non painful patellar compression. Patellar and quadriceps tendons unremarkable. Hamstring and quadriceps strength is normal.  Procedure: Real-time Ultrasound Guided aspiration of left knee Device: GE Logiq E  Verbal informed consent obtained.  Time-out conducted.  Noted no overlying erythema, induration, or other signs of local infection.  Skin prepped in a sterile fashion.  Local anesthesia: Topical Ethyl chloride.  With sterile technique and under real time ultrasound  guidance: Using an 18-gauge needle I aspirated 22 cc clear, straw-colored fluid. Completed without difficulty  Pain immediately resolved suggesting accurate placement of the  medication.  Advised to call if fevers/chills, erythema, induration, drainage, or persistent bleeding.  Images permanently stored and available for review in the ultrasound unit.  Impression: Technically successful ultrasound guided injection.  Impression and Recommendations:   This case required medical decision making of moderate complexity.  Left knee osteoarthritis, post right knee arthroplasty Repeat aspiration, previous injection was 2 months ago. At this point proceeding with x-ray, MRI for procedural planning.  Left lumbar radiculitis X-rays, formal PT. Return in 6 weeks, MR for interventional planning if no better.  ___________________________________________ Ihor Austin. Benjamin Stain, M.D., ABFM., CAQSM. Primary Care and Sports Medicine [facility]  Adjunct Instructor of Family Medicine  [employer]

## 2017-10-23 NOTE — Assessment & Plan Note (Signed)
X-rays, formal PT. Return in 6 weeks, MR for interventional planning if no better.

## 2017-10-23 NOTE — Assessment & Plan Note (Signed)
Repeat aspiration, previous injection was 2 months ago. At this point proceeding with x-ray, MRI for procedural planning.

## 2017-10-29 ENCOUNTER — Ambulatory Visit (INDEPENDENT_AMBULATORY_CARE_PROVIDER_SITE_OTHER): Payer: PRIVATE HEALTH INSURANCE

## 2017-10-29 DIAGNOSIS — M25462 Effusion, left knee: Secondary | ICD-10-CM | POA: Diagnosis not present

## 2017-10-29 DIAGNOSIS — M2342 Loose body in knee, left knee: Secondary | ICD-10-CM | POA: Diagnosis not present

## 2017-10-29 DIAGNOSIS — M23222 Derangement of posterior horn of medial meniscus due to old tear or injury, left knee: Secondary | ICD-10-CM | POA: Diagnosis not present

## 2017-10-29 DIAGNOSIS — M170 Bilateral primary osteoarthritis of knee: Secondary | ICD-10-CM

## 2017-11-19 ENCOUNTER — Other Ambulatory Visit: Payer: Self-pay | Admitting: Sports Medicine

## 2017-11-19 DIAGNOSIS — M170 Bilateral primary osteoarthritis of knee: Secondary | ICD-10-CM

## 2017-12-04 ENCOUNTER — Ambulatory Visit: Payer: PRIVATE HEALTH INSURANCE | Admitting: Sports Medicine

## 2017-12-17 ENCOUNTER — Ambulatory Visit: Payer: PRIVATE HEALTH INSURANCE | Admitting: Rehabilitative and Restorative Service Providers"

## 2017-12-17 DIAGNOSIS — M25562 Pain in left knee: Secondary | ICD-10-CM

## 2017-12-17 DIAGNOSIS — M6281 Muscle weakness (generalized): Secondary | ICD-10-CM | POA: Diagnosis not present

## 2017-12-17 DIAGNOSIS — M25662 Stiffness of left knee, not elsewhere classified: Secondary | ICD-10-CM | POA: Diagnosis not present

## 2017-12-17 DIAGNOSIS — R2689 Other abnormalities of gait and mobility: Secondary | ICD-10-CM

## 2017-12-17 NOTE — Therapy (Addendum)
[facility] [place] [address] [place], [place], [address] Phone: [phone]   Fax:  [phone]  Physical Therapy Evaluation  Patient Details  Name: Terry Greene MRN: [id number] Date of Birth: [birth year] Referring Provider: Dr Juliann Pulse    Encounter Date: 12/17/2017  PT End of Session - 12/17/17 0818    Visit Number  1    Number of Visits  24    Date for PT Re-Evaluation  03/11/18    PT Start Time  0814 pt late for appt     PT Stop Time  0912    PT Time Calculation (min)  58 min    Activity Tolerance  Patient tolerated treatment well       No past medical history on file.  No past surgical history on file.  There were no vitals filed for this visit.   Subjective Assessment - 12/17/17 0819    Subjective  Patient reports continued Lt knee pain which has been present for years. He underwent Lt TKA 12/07/17    Pertinent History  Rt TKA 01/19/17; throat cancer 10 yrs ago; HTN    How long can you sit comfortably?  2 min     How long can you stand comfortably?  2 min     How long can you walk comfortably?  5 min     Diagnostic tests  xrays     Patient Stated Goals  get knee moving and strong; walk without walker     Currently in Pain?  Yes    Pain Score  5     Pain Location  Knee    Pain Orientation  Left    Pain Descriptors / Indicators  Aching    Pain Type  Surgical pain;Chronic pain    Pain Radiating Towards  thigh; sometimes to front of ankle     Pain Onset  1 to 4 weeks ago    Pain Frequency  Constant    Aggravating Factors   movement; prolonged positions; standing; walking     Pain Relieving Factors  meds; ice          [facility] PT Assessment - 12/17/17 0001      Assessment   Medical Diagnosis  Lt TKA     Referring Provider  Dr Juliann Pulse     Onset Date/Surgical Date  12/07/17    Hand Dominance  Right    Next MD Visit  12/20/17    Prior Therapy  for Rt TKA       Precautions   Precautions  None       Restrictions   Weight Bearing Restrictions  No      Balance Screen   Has the patient fallen in the past 6 months  No    Has the patient had a decrease in activity level because of a fear of falling?   No    Is the patient reluctant to leave their home because of a fear of falling?   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     Leisure  yard work; Systems analyst; hunting       Observation/Other Assessments   Focus on Therapeutic Outcomes (FOTO)   74% limitation       Sensation   Additional Comments  WFL's per pt report       Posture/Postural Control   Posture Comments  flexed forward at hips/trunk with standing and walking       AROM   Right Knee Extension  0    Right Knee Flexion  101    Left Knee Extension  -11    Right/Left Ankle  -- WFL's bilat       Strength   Right Hip Flexion  5/5    Right Hip Extension  5/5    Right Hip ABduction  5/5    Left Hip Flexion  4/5    Left Hip Extension  4+/5    Left Hip ABduction  4/5    Right Knee Flexion  5/5    Right Knee Extension  5/5    Left Knee Flexion  -- NT    Left Knee Extension  -- NT      Ambulation/Gait   Ambulation Distance (Feet)  80 Feet    Assistive device  Rolling walker    Gait Pattern  Step-to pattern    Ambulation Surface  Level    Gait Comments  flexed forward posture              Objective measurements completed on examination: See above findings.                PT Short Term Goals - 12/17/17 1304      PT SHORT TERM GOAL #1    Title  Increase AROM to (-)4 to 100 deg 01/28/18    Time  6    Period  Weeks    Status  New      PT SHORT TERM GOAL #2   Title  Patient to ambulate with good gait pattern with SPC for functional distances of at least 10-15 min 01/28/18    Time  6    Period  Weeks    Status  New      PT SHORT TERM GOAL #3   Title  Independent with initial HEP 01/28/18    Time  6    Period  Weeks    Status  New        PT Long Term Goals - 12/17/17 1305      PT LONG TERM GOAL #1   Title  Increase AROM Lt knee to (-) 2 deg extension to 110 deg flexion 03/11/18    Time  12    Period  Weeks    Status  New      PT LONG TERM GOAL #2   Title  5/5/ strength Lt LE 03/11/18    Time  12    Period  Weeks    Status  New      PT LONG TERM GOAL #3   Title  Ambulate without assistive device with good gait pattern for functional distances and at least 20-30 min  03/11/18    Time  12    Period  Weeks    Status  New      PT LONG TERM GOAL #4   Title  Improve FOTO  to </= 45% limitation 03/11/18    Time  12    Period  Weeks    Status  New      PT LONG TERM GOAL #5   Title  Independent in HEP 03/11/18    Time  12    Period  Weeks    Status  New             Plan - 12/17/17 1300    Clinical Impression Statement  Patient presents s/p Lt TKA 12/07/17 with poor posture and alignment in standing and with gait; abnormal gait pattern; limited Lt knee ROM and strength; muscular tightness Lt LE; pain and limited functional activity level. He will benefit from PT to address problems identified.     History and Personal Factors relevant to plan of care:  Rt TKA 4/18     Clinical Presentation  Stable    Clinical Decision Making  Low    Rehab Potential  Good    PT Frequency  2x / week    PT Duration  12 weeks    PT Treatment/Interventions  Patient/family education;ADLs/Self Care Home Management;Cryotherapy;Electrical Stimulation;Iontophoresis 4mg /ml Dexamethasone;Moist Heat;Ultrasound;Dry needling;Manual  techniques;Neuromuscular re-education;Gait training;Functional mobility training;Therapeutic activities;Therapeutic exercise    PT Next Visit Plan  review HEP; progress with stretching and ROM exercises; strengthening; gait training; manual work and modalities as indicated     Consulted and Agree with Plan of Care  Patient       Patient will benefit from skilled therapeutic intervention in order to improve the following deficits and impairments:  Postural dysfunction, Improper body mechanics, Pain, Increased fascial restricitons, Increased muscle spasms, Decreased mobility, Decreased range of motion, Decreased strength, Decreased activity tolerance, Abnormal gait  Visit Diagnosis: Stiffness of left knee - Plan: PT plan of care cert/re-cert  Acute pain of left knee - Plan: PT plan of care cert/re-cert  Muscle weakness (generalized) - Plan: PT plan of care cert/re-cert  Other abnormalities of gait and mobility - Plan: PT plan of care cert/re-cert     Problem List Patient Active Problem List   Diagnosis Date Noted  . Left lumbar radiculitis 10/23/2017  . Left knee osteoarthritis, post right knee arthroplasty 11/02/2015    Lynnix Schoneman Rober MinionP Satya Bohall PT, MPH  12/17/2017, 1:09 PM  [facility] [address] [place], [place], [address] Phone: [phone]   Fax:  [phone]  Name: Terry CellaMichael Graca MRN: [id number] Date of Birth: [birth year]

## 2017-12-20 ENCOUNTER — Ambulatory Visit: Payer: PRIVATE HEALTH INSURANCE | Admitting: Physical Therapy

## 2017-12-20 DIAGNOSIS — M6281 Muscle weakness (generalized): Secondary | ICD-10-CM

## 2017-12-20 DIAGNOSIS — M25562 Pain in left knee: Secondary | ICD-10-CM

## 2017-12-20 DIAGNOSIS — M25662 Stiffness of left knee, not elsewhere classified: Secondary | ICD-10-CM

## 2017-12-20 NOTE — Therapy (Signed)
[facility] [address] [place], [place], [address] Phone: [phone]   Fax:  [phone]  Physical Therapy Treatment  Patient Details  Name: Terry Greene MRN: [id number] Date of Birth: 02-17-1956 Referring Provider: Dr. Juliann PulseJames Mitchell   Encounter Date: 12/20/2017  PT End of Session - 12/20/17 1022    Visit Number  2    Number of Visits  12    Date for PT Re-Evaluation  03/11/18    PT Start Time  0930    PT Stop Time  1027    PT Time Calculation (min)  57 min    Activity Tolerance  Patient tolerated treatment well    Behavior During Therapy  [facility] for tasks assessed/performed       No past medical history on file.  No past surgical history on file.  There were no vitals filed for this visit.  Subjective Assessment - 12/20/17 1022    Subjective  Pt reports he is still working on pain management with meds.  He gets his stitches out today and he would like to transition to cane today as well (ambulating into therapy with RW)    Patient Stated Goals  get knee moving and strong; walk without walker     Currently in Pain?  Yes    Pain Score  5     Pain Location  Knee    Pain Orientation  Left    Pain Descriptors / Indicators  Aching;Tightness         [facility] PT Assessment - 12/20/17 0001      Assessment   Medical Diagnosis  Lt TKA     Referring Provider  Dr. Juliann PulseJames Mitchell    Onset Date/Surgical Date  12/07/17    Hand Dominance  Right    Next MD Visit  12/20/17    Prior Therapy  for Rt TKA       AROM   AROM Assessment Site  Knee    Right/Left Knee  Left    Right Knee Flexion  105    Left Knee Flexion  91 during seated scoot; 87 deg during bicycle      Flexibility   Soft Tissue Assessment /Muscle Length  yes    Quadriceps  Rt knee flexion 100 deg; Lt knee flexion 70 deg       [facility] Adult PT Treatment/Exercise - 12/20/17 0001      Knee/Hip Exercises: Stretches   Passive Hamstring Stretch  Left;3 reps;30  seconds supine with strap    Quad Stretch  Right;Left;2 reps;30 seconds prone, with strap    Gastroc Stretch  --      Knee/Hip Exercises: Aerobic   Stationary Bike  Partial revolutions for 5 min       Knee/Hip Exercises: Standing   Gait Training  Gait training with SPC with step through pattern: repeated cues for distance of SPC, step length, heel strike and to allow Lt knee to flex during swing through phases.  Improved with increased distance. Total distance ~320 ft.      Knee/Hip Exercises: Seated   Other Seated Knee/Hip Exercises  seated scoots x 10 sec hold x 10 reps    Sit to Sand  1 set;10 reps;with UE support to low black mat; VC for increased wt shift to Lt      Knee/Hip Exercises: Supine   Heel Slides  AAROM;Left;1 set;5 reps      Knee/Hip Exercises: Prone   Hamstring Curl  2 sets;5 reps  between prone hang reps; assist of RLE    Prone Knee Hang  1 minute 3 reps      Modalities   Modalities  Vasopneumatic      Vasopneumatic   Number Minutes Vasopneumatic   15 minutes    Vasopnuematic Location   Knee Lt    Vasopneumatic Pressure  Low    Vasopneumatic Temperature   34 deg               PT Short Term Goals - 12/17/17 1304      PT SHORT TERM GOAL #1   Title  Increase AROM to (-)4 to 100 deg 01/28/18    Time  6    Period  Weeks    Status  New      PT SHORT TERM GOAL #2   Title  Patient to ambulate with good gait pattern with SPC for functional distances of at least 10-15 min 01/28/18    Time  6    Period  Weeks    Status  New      PT SHORT TERM GOAL #3   Title  Independent with initial HEP 01/28/18    Time  6    Period  Weeks    Status  New        PT Long Term Goals - 12/17/17 1305      PT LONG TERM GOAL #1   Title  Increase AROM Rt knee to (-) 2 deg extension to 110 deg flexion 03/11/18    Time  12    Period  Weeks    Status  New      PT LONG TERM GOAL #2   Title  5/5/ strength Rt LE 03/11/18    Time  12    Period  Weeks    Status  New       PT LONG TERM GOAL #3   Title  Ambulate without assistive device with good gait pattern for functional distances and at least 20-30 min  03/11/18    Time  12    Period  Weeks    Status  New      PT LONG TERM GOAL #4   Title  Improve FOTO to </= 45% limitation 03/11/18    Time  12    Period  Weeks    Status  New      PT LONG TERM GOAL #5   Title  Independent in HEP 03/11/18    Time  12    Period  Weeks    Status  New            Plan - 12/20/17 1120    Clinical Impression Statement  Pt tolerated all exercises well, with mild increase in pain with ROM exercises for Lt knee.  He transitioned from RW to [facility] with only minor cues; gait quality improved with increased distance and cues.  Progressing towards established therapy goals.     Rehab Potential  Good    PT Frequency  2x / week    PT Duration  12 weeks    PT Treatment/Interventions  Patient/family education;ADLs/Self Care Home Management;Cryotherapy;Electrical Stimulation;Iontophoresis 4mg /ml Dexamethasone;Moist Heat;Ultrasound;Dry needling;Manual techniques;Neuromuscular re-education;Gait training;Functional mobility training;Therapeutic activities;Therapeutic exercise    PT Next Visit Plan  continue progressive ROM and strengthening exercises for Lt knee. Manual therapy and modalities as indicated.     Consulted and Agree with Plan of Care  Patient       Patient will benefit from skilled therapeutic intervention  in order to improve the following deficits and impairments:  Postural dysfunction, Improper body mechanics, Pain, Increased fascial restricitons, Increased muscle spasms, Decreased mobility, Decreased range of motion, Decreased strength, Decreased activity tolerance, Abnormal gait  Visit Diagnosis: Stiffness of left knee  Acute pain of left knee  Muscle weakness (generalized)     Problem List Patient Active Problem List   Diagnosis Date Noted  . Left lumbar radiculitis 10/23/2017  . Left knee osteoarthritis,  post right knee arthroplasty 11/02/2015   Mayer Camel, PTA 12/20/17 11:21 AM  [facility] [place] [address] [place], [place], [address] Phone: [phone]   Fax:  [phone]  Name: Terry Greene MRN: [id number] Date of Birth: 1955-11-05

## 2017-12-24 ENCOUNTER — Ambulatory Visit: Payer: PRIVATE HEALTH INSURANCE | Admitting: Physical Therapy

## 2017-12-24 ENCOUNTER — Encounter: Payer: Self-pay | Admitting: Physical Therapy

## 2017-12-24 DIAGNOSIS — M6281 Muscle weakness (generalized): Secondary | ICD-10-CM

## 2017-12-24 DIAGNOSIS — M25562 Pain in left knee: Secondary | ICD-10-CM | POA: Diagnosis not present

## 2017-12-24 DIAGNOSIS — M25662 Stiffness of left knee, not elsewhere classified: Secondary | ICD-10-CM | POA: Diagnosis not present

## 2017-12-24 NOTE — Patient Instructions (Signed)
Balance: One-Leg    Stand on left leg on Foot Triangle of Support. Use hand supports. Let go with left hand, then right hand. Support as little as possible but as much as needed. Stand __as long as able_ seconds. Maintain slight bend in left knee.

## 2017-12-24 NOTE — Therapy (Signed)
[facility] [place] [address] [place], [place], [address] Phone: [phone]   Fax:  [phone]  Physical Therapy Treatment  Patient Details  Name: Terry Greene MRN: [id number] Date of Birth: May 25, 1956 Referring Provider: Dr. Juliann PulseJames Mitchell   Encounter Date: 12/24/2017  PT End of Session - 12/24/17 1101    Visit Number  3    Number of Visits  12    Date for PT Re-Evaluation  03/11/18    PT Start Time  1018    PT Stop Time  1116    PT Time Calculation (min)  58 min    Activity Tolerance  Patient tolerated treatment well       History reviewed. No pertinent past medical history.  History reviewed. No pertinent surgical history.  There were no vitals filed for this visit.  Subjective Assessment - 12/24/17 1020    Subjective  SAt AM his LT knee buckled BWD, feels better today however still very sore n the back of the knee.    Patient Stated Goals  get knee moving and strong; walk without walker     Currently in Pain?  Yes    Pain Score  5     Pain Location  Knee    Pain Orientation  Left    Pain Descriptors / Indicators  Aching    Pain Type  Surgical pain    Pain Onset  More than a month ago    Pain Frequency  Constant    Aggravating Factors   hyper extending the knee on Sat    Pain Relieving Factors  ice         [facility] PT Assessment - 12/24/17 0001      Assessment   Medical Diagnosis  Lt TKA                   [facility] Adult PT Treatment/Exercise - 12/24/17 0001      Knee/Hip Exercises: Stretches   Active Hamstring Stretch  Left;30 seconds with strap performing quad sets.       Knee/Hip Exercises: Aerobic   Stationary Bike  Partial revolutions for 5 min  almost go around backwards      Knee/Hip Exercises: Standing   SLS  Lt with UE support      Knee/Hip Exercises: Supine   Short Arc Quad Sets  Strengthening;Left;20 reps      Knee/Hip Exercises: Prone   Hamstring Curl  10 reps      Modalities   Modalities  Ultrasound;Vasopneumatic      Ultrasound   Ultrasound Location  posterior Lt knee    Ultrasound Parameters  50%, 1.690mHz, 1.0w/cm2    Ultrasound Goals  Edema;Pain      Vasopneumatic   Number Minutes Vasopneumatic   15 minutes    Vasopnuematic Location   Knee    Vasopneumatic Pressure  Low    Vasopneumatic Temperature   3*      Manual Therapy   Manual Therapy  Soft tissue mobilization    Soft tissue mobilization  retrograde masage to Lt knee - posterior, and TPR               PT Short Term Goals - 12/24/17 1024      PT SHORT TERM GOAL #1   Title  Increase AROM to (-)4 to 100 deg 01/28/18    Status  On-going      PT SHORT TERM GOAL #2   Title  Patient to ambulate with  good gait pattern with SPC for functional distances of at least 10-15 min 01/28/18    Status  On-going using cane now however had gait deviations due to pain      PT SHORT TERM GOAL #3   Title  Independent with initial HEP 01/28/18    Status  On-going        PT Long Term Goals - 12/24/17 1025      PT LONG TERM GOAL #1   Title  Increase AROM Rt knee to (-) 2 deg extension to 110 deg flexion 03/11/18    Status  On-going      PT LONG TERM GOAL #2   Title  5/5/ strength Rt LE 03/11/18    Status  On-going      PT LONG TERM GOAL #3   Title  Ambulate without assistive device with good gait pattern for functional distances and at least 20-30 min  03/11/18    Status  On-going      PT LONG TERM GOAL #4   Title  Improve FOTO to </= 45% limitation 03/11/18    Status  On-going      PT LONG TERM GOAL #5   Title  Independent in HEP 03/11/18    Status  On-going            Plan - 12/24/17 1101    Clinical Impression Statement  mike had an incident over the weekend where he hyperextended his surgery leg and is having increased posterior knee pain now. He responded well to therapy with reported decreased pain and increased feelings of flexibility.     Rehab Potential  Good    PT  Frequency  2x / week    PT Duration  12 weeks    PT Treatment/Interventions  Patient/family education;ADLs/Self Care Home Management;Cryotherapy;Electrical Stimulation;Iontophoresis 4mg /ml Dexamethasone;Moist Heat;Ultrasound;Dry needling;Manual techniques;Neuromuscular re-education;Gait training;Functional mobility training;Therapeutic activities;Therapeutic exercise    PT Next Visit Plan  continue progressive ROM and strengthening exercises for Lt knee. Manual therapy and modalities as indicated.     Consulted and Agree with Plan of Care  Patient       Patient will benefit from skilled therapeutic intervention in order to improve the following deficits and impairments:  Postural dysfunction, Improper body mechanics, Pain, Increased fascial restricitons, Increased muscle spasms, Decreased mobility, Decreased range of motion, Decreased strength, Decreased activity tolerance, Abnormal gait  Visit Diagnosis: Stiffness of left knee  Acute pain of left knee  Muscle weakness (generalized)     Problem List Patient Active Problem List   Diagnosis Date Noted  . Left lumbar radiculitis 10/23/2017  . Left knee osteoarthritis, post right knee arthroplasty 11/02/2015    Roderic Scarce PT 12/24/2017, 11:03 AM  [facility] [address] [place], [place], [address] Phone: [phone]   Fax:  [phone]  Name: Terry Greene MRN: [id number] Date of Birth: 1956/09/02

## 2017-12-27 ENCOUNTER — Ambulatory Visit: Payer: PRIVATE HEALTH INSURANCE | Admitting: Physical Therapy

## 2017-12-27 DIAGNOSIS — R2689 Other abnormalities of gait and mobility: Secondary | ICD-10-CM

## 2017-12-27 DIAGNOSIS — M25662 Stiffness of left knee, not elsewhere classified: Secondary | ICD-10-CM

## 2017-12-27 DIAGNOSIS — M6281 Muscle weakness (generalized): Secondary | ICD-10-CM

## 2017-12-27 DIAGNOSIS — M25562 Pain in left knee: Secondary | ICD-10-CM | POA: Diagnosis not present

## 2017-12-27 NOTE — Patient Instructions (Signed)
TENS UNIT  This is helpful for muscle pain and spasm.   Search and Purchase a TENS 7000 2nd edition at www.amazon.com  (It should be less than $30)     TENS unit instructions:   Do not shower or bathe with the unit on  Turn the unit off before removing electrodes or batteries  If the electrodes lose stickiness add a drop of water to the electrodes after they are disconnected from the unit and place on plastic sheet. If you continued to have difficulty, call the TENS unit company to purchase more electrodes.  Do not apply lotion on the skin area prior to use. Make sure the skin is clean and dry as this will help prolong the life of the electrodes.  After use, always check skin for unusual red areas, rash or other skin difficulties. If there are any skin problems, does not apply electrodes to the same area.  Never remove the electrodes from the unit by pulling the wires.  Do not use the TENS unit or electrodes other than as directed.  Do not change electrode placement without consulting your therapist or physician.  Keep 2 fingers with between each electrode.  

## 2017-12-27 NOTE — Therapy (Signed)
[facility] [place] [address] [place], [place], [address] Phone: [phone]   Fax:  [phone]  Physical Therapy Treatment  Patient Details  Name: Terry Greene MRN: [id number] Date of Birth: 05/03/1956 Referring Provider: Dr. Juliann Pulse   Encounter Date: 12/27/2017  PT End of Session - 12/27/17 1112    Visit Number  4    Number of Visits  12    Date for PT Re-Evaluation  03/11/18    PT Start Time  1100    PT Stop Time  1156    PT Time Calculation (min)  56 min    Activity Tolerance  Patient tolerated treatment well    Behavior During Therapy  [facility] for tasks assessed/performed       No past medical history on file.  No past surgical history on file.  There were no vitals filed for this visit.  Subjective Assessment - 12/27/17 1115    Subjective  Pt reports he feels he has more swelling in the knee, "I don't feel like it is bending as good today as it did yesterday".     Patient Stated Goals  get knee moving and strong; walk without walker     Currently in Pain?  Yes    Pain Score  3     Pain Location  Knee    Pain Orientation  Left    Pain Descriptors / Indicators  Aching    Aggravating Factors   leaving it in one position too long    Pain Relieving Factors  ice         [facility] PT Assessment - 12/27/17 0001      Assessment   Medical Diagnosis  Lt TKA     Referring Provider  Dr. Juliann Pulse    Onset Date/Surgical Date  12/07/17    Hand Dominance  Right    Next MD Visit  12/20/17    Prior Therapy  for Rt TKA       AROM   Left Knee Extension  -5 with quad set in supine    Left Knee Flexion  94 during bicycle       [facility] Adult PT Treatment/Exercise - 12/27/17 0001      Ambulation/Gait   Ambulation Distance (Feet)  160 Feet    Assistive device  Straight cane    Gait Pattern  Step-through pattern    Ambulation Surface  Level;Indoor    Gait Comments  VC for increased heel stike and increased knee  flexion during swing through, to decrease step length RLE,       Knee/Hip Exercises: Stretches   Active Hamstring Stretch  Left;30 seconds;3 reps with strap performing quad sets.     Hip Flexor Stretch  Left;2 reps;30 seconds thomas position, Rt knee to chest.     Gastroc Stretch  Left;3 reps;30 seconds      Knee/Hip Exercises: Aerobic   Stationary Bike  Partial revolutions for 7 min  PTA present to discuss progress      Knee/Hip Exercises: Standing   Forward Step Up  Left;1 set;Hand Hold: 2;Step Height: 6";15 reps    Step Down  Right;1 set;10 reps;Hand Hold: 2;Step Height: 4" challenging      Knee/Hip Exercises: Seated   Other Seated Knee/Hip Exercises  seated scoots x 10 sec hold x 5 reps    Sit to Sand  1 set;10 reps;with UE support to low black mat; VC for increased wt shift to [employer]  Knee/Hip Exercises: Supine   Quad Sets  Left;1 set;5 reps    Heel Prop for Knee Extension  2 minutes      Modalities   Modalities  Electrical Stimulation;Vasopneumatic      Programme researcher, broadcasting/film/videolectrical Stimulation   Electrical Stimulation Location  Lt knee    Electrical Stimulation Action  ion repelling    Electrical Stimulation Parameters  to tolerance    Electrical Stimulation Goals  Edema;Pain      Vasopneumatic   Number Minutes Vasopneumatic   15 minutes    Vasopnuematic Location   Knee    Vasopneumatic Pressure  Low    Vasopneumatic Temperature   34 deg             PT Education - 12/27/17 1141    Education provided  Yes    Education Details  TENS info    Person(s) Educated  Patient    Methods  Explanation;Handout    Comprehension  Verbalized understanding       PT Short Term Goals - 12/24/17 1024      PT SHORT TERM GOAL #1   Title  Increase AROM to (-)4 to 100 deg 01/28/18    Status  On-going      PT SHORT TERM GOAL #2   Title  Patient to ambulate with good gait pattern with SPC for functional distances of at least 10-15 min 01/28/18    Status  On-going using cane now however had gait  deviations due to pain      PT SHORT TERM GOAL #3   Title  Independent with initial HEP 01/28/18    Status  On-going        PT Long Term Goals - 12/24/17 1025      PT LONG TERM GOAL #1   Title  Increase AROM Rt knee to (-) 2 deg extension to 110 deg flexion 03/11/18    Status  On-going      PT LONG TERM GOAL #2   Title  5/5/ strength Rt LE 03/11/18    Status  On-going      PT LONG TERM GOAL #3   Title  Ambulate without assistive device with good gait pattern for functional distances and at least 20-30 min  03/11/18    Status  On-going      PT LONG TERM GOAL #4   Title  Improve FOTO to </= 45% limitation 03/11/18    Status  On-going      PT LONG TERM GOAL #5   Title  Independent in HEP 03/11/18    Status  On-going            Plan - 12/27/17 1144    Clinical Impression Statement  Pt demonstrated a gradual gain of ROM in Lt knee.  He is challenged with descending 4" stairs.  He tolerated exercises well, with mild increase in pain.  Pain reduced with use of estim/ vaso at end of session.  Progressing towards goals.     Rehab Potential  Good    PT Frequency  2x / week    PT Duration  12 weeks    PT Treatment/Interventions  Patient/family education;ADLs/Self Care Home Management;Cryotherapy;Electrical Stimulation;Iontophoresis 4mg /ml Dexamethasone;Moist Heat;Ultrasound;Dry needling;Manual techniques;Neuromuscular re-education;Gait training;Functional mobility training;Therapeutic activities;Therapeutic exercise    PT Next Visit Plan  continue progressive ROM and strengthening exercises for Lt knee. Manual therapy and modalities as indicated.     Consulted and Agree with Plan of Care  Patient       Patient  will benefit from skilled therapeutic intervention in order to improve the following deficits and impairments:  Postural dysfunction, Improper body mechanics, Pain, Increased fascial restricitons, Increased muscle spasms, Decreased mobility, Decreased range of motion, Decreased  strength, Decreased activity tolerance, Abnormal gait  Visit Diagnosis: Stiffness of left knee  Acute pain of left knee  Muscle weakness (generalized)  Other abnormalities of gait and mobility     Problem List Patient Active Problem List   Diagnosis Date Noted  . Left lumbar radiculitis 10/23/2017  . Left knee osteoarthritis, post right knee arthroplasty 11/02/2015   Mayer Camel, PTA 12/27/17 1:00 PM  [facility] [place] [address] [place], [place], [address] Phone: [phone]   Fax:  [phone]  Name: Eshawn Coor MRN: [id number] Date of Birth: [birth year]

## 2017-12-31 ENCOUNTER — Encounter: Payer: Self-pay | Admitting: Rehabilitative and Restorative Service Providers"

## 2017-12-31 ENCOUNTER — Ambulatory Visit: Payer: PRIVATE HEALTH INSURANCE | Admitting: Rehabilitative and Restorative Service Providers"

## 2017-12-31 DIAGNOSIS — M25562 Pain in left knee: Secondary | ICD-10-CM | POA: Diagnosis not present

## 2017-12-31 DIAGNOSIS — R2689 Other abnormalities of gait and mobility: Secondary | ICD-10-CM

## 2017-12-31 DIAGNOSIS — M6281 Muscle weakness (generalized): Secondary | ICD-10-CM

## 2017-12-31 DIAGNOSIS — M25662 Stiffness of left knee, not elsewhere classified: Secondary | ICD-10-CM

## 2017-12-31 NOTE — Therapy (Signed)
[facility] [place] [address] [place], [place], [address] Phone: [phone]   Fax:  [phone]  Physical Therapy Treatment  Patient Details  Name: Jayzen Paver MRN: [id number] Date of Birth: [birth year] Referring Provider: Dr Juliann Pulse   Encounter Date: 12/31/2017  PT End of Session - 12/31/17 1032    Visit Number  5    Number of Visits  12    Date for PT Re-Evaluation  03/11/18    PT Start Time  1016    PT Stop Time  1112    PT Time Calculation (min)  56 min    Activity Tolerance  Patient tolerated treatment well       History reviewed. No pertinent past medical history.  History reviewed. No pertinent surgical history.  There were no vitals filed for this visit.  Subjective Assessment - 12/31/17 1031    Subjective  Working on the yard and doing some power washing over the weekend - sore and tight today. Mostly walking without cane. Using the cane more for security.     Currently in Pain?  Yes    Pain Score  3     Pain Location  Knee    Pain Orientation  Left    Pain Descriptors / Indicators  Tightness;Aching    Pain Type  Surgical pain         [facility] PT Assessment - 12/31/17 0001      Assessment   Medical Diagnosis  Lt TKA     Referring Provider  Dr Juliann Pulse    Onset Date/Surgical Date  12/07/17    Hand Dominance  Right    Next MD Visit  12/20/17    Prior Therapy  for Rt TKA       AROM   Left Knee Flexion  100            No data recorded       [facility] Adult PT Treatment/Exercise - 12/31/17 0001      Ambulation/Gait   Ambulation Distance (Feet)  160 Feet    Assistive device  None    Gait Pattern  Step-through pattern    Ambulation Surface  Level;Indoor      Knee/Hip Exercises: Stretches   Active Hamstring Stretch  Left;30 seconds;3 reps with strap performing quad sets.     Quad Stretch  Right;Left;2 reps;30 seconds prone with strap - repeated x 2 with noodle at distal thigh      Hip Flexor Stretch  Left;2 reps;30 seconds thomas position, Rt knee to chest.     Gastroc Stretch  Left;3 reps;30 seconds      Knee/Hip Exercises: Aerobic   Stationary Bike  Partial then full revolutions for 7 min  PTA present to discuss progress      Knee/Hip Exercises: Standing   Forward Step Up  Left;1 set;Hand Hold: 2;Step Height: 6";15 reps      Knee/Hip Exercises: Seated   Sit to Sand  1 set;10 reps;with UE support to low black mat; VC for increased wt shift to Lt      Knee/Hip Exercises: Supine   Other Supine Knee/Hip Exercises  knee flexion/extension with large green ball using strap to stretch into flexion 15 sec hold x 10       Electrical Stimulation   Electrical Stimulation Location  Lt knee    Electrical Stimulation Action  ion repelling     Electrical Stimulation Parameters  to tolerance    Electrical Stimulation Goals  Edema;Pain      Vasopneumatic   Number Minutes Vasopneumatic   15 minutes    Vasopnuematic Location   Knee    Vasopneumatic Pressure  Low    Vasopneumatic Temperature   34 deg      Manual Therapy   Soft tissue mobilization  deep tissue work through the quad with Lt LE in stretch position                PT Short Term Goals - 12/24/17 1024      PT SHORT TERM GOAL #1   Title  Increase AROM to (-)4 to 100 deg 01/28/18    Status  On-going      PT SHORT TERM GOAL #2   Title  Patient to ambulate with good gait pattern with SPC for functional distances of at least 10-15 min 01/28/18    Status  On-going using cane now however had gait deviations due to pain      PT SHORT TERM GOAL #3   Title  Independent with initial HEP 01/28/18    Status  On-going        PT Long Term Goals - 12/31/17 1033      PT LONG TERM GOAL #1   Title  Increase AROM Rt knee to (-) 2 deg extension to 110 deg flexion 03/11/18    Time  12    Period  Weeks    Status  On-going      PT LONG TERM GOAL #2   Title  5/5/ strength Rt LE 03/11/18    Time  12    Period   Weeks    Status  On-going      PT LONG TERM GOAL #3   Title  Ambulate without assistive device with good gait pattern for functional distances and at least 20-30 min  03/11/18    Time  12    Period  Weeks    Status  On-going      PT LONG TERM GOAL #4   Title  Improve FOTO to </= 45% limitation 03/11/18    Time  12    Period  Weeks    Status  On-going      PT LONG TERM GOAL #5   Title  Independent in HEP 03/11/18    Time  12    Period  Weeks    Status  On-going            Plan - 12/31/17 1033    Clinical Impression Statement  Cotinued gradual progress with mobility and function. He will try using ice at night to get things to calm down and try to sleep better. Gradually progressing toward stated goals of therapy.     Rehab Potential  Good    PT Frequency  2x / week    PT Duration  12 weeks    PT Treatment/Interventions  Patient/family education;ADLs/Self Care Home Management;Cryotherapy;Electrical Stimulation;Iontophoresis 4mg /ml Dexamethasone;Moist Heat;Ultrasound;Dry needling;Manual techniques;Neuromuscular re-education;Gait training;Functional mobility training;Therapeutic activities;Therapeutic exercise    PT Next Visit Plan  continue progressive ROM and strengthening exercises for Lt knee. Manual therapy and modalities as indicated.     Consulted and Agree with Plan of Care  Patient       Patient will benefit from skilled therapeutic intervention in order to improve the following deficits and impairments:  Postural dysfunction, Improper body mechanics, Pain, Increased fascial restricitons, Increased muscle spasms, Decreased mobility, Decreased range of motion, Decreased strength, Decreased activity tolerance, Abnormal gait  Visit Diagnosis:  Stiffness of left knee  Acute pain of left knee  Muscle weakness (generalized)  Other abnormalities of gait and mobility  Stiffness of left knee, not elsewhere classified     Problem List Patient Active Problem List    Diagnosis Date Noted  . Left lumbar radiculitis 10/23/2017  . Left knee osteoarthritis, post right knee arthroplasty 11/02/2015    Latrel Szymczak Rober Minion PT, MPH  12/31/2017, 11:05 AM  [facility] [address] [place], [place], [address] Phone: [phone]   Fax:  [phone]  Name: Vencil Basnett MRN: [id number] Date of Birth: November 26, 1955

## 2018-01-03 ENCOUNTER — Encounter: Payer: Self-pay | Admitting: Physical Therapy

## 2018-01-03 ENCOUNTER — Ambulatory Visit: Payer: PRIVATE HEALTH INSURANCE | Admitting: Physical Therapy

## 2018-01-03 DIAGNOSIS — M25562 Pain in left knee: Secondary | ICD-10-CM | POA: Diagnosis not present

## 2018-01-03 DIAGNOSIS — M6281 Muscle weakness (generalized): Secondary | ICD-10-CM | POA: Diagnosis not present

## 2018-01-03 DIAGNOSIS — M25662 Stiffness of left knee, not elsewhere classified: Secondary | ICD-10-CM | POA: Diagnosis not present

## 2018-01-03 NOTE — Therapy (Signed)
[place], [place], [address] Phone: [phone]   Fax:  [phone]  Physical Therapy Treatment  Patient Details  Name: Terry Greene MRN: [id number] Date of Birth: [birth year] Referring Provider: Dr. Minda Meo   Encounter Date: 01/03/2018  PT End of Session - 01/03/18 1106    Visit Number  6    Number of Visits  12    Date for PT Re-Evaluation  03/11/18    PT Start Time  1027    PT Stop Time  1116    PT Time Calculation (min)  61 min    Activity Tolerance  Patient tolerated treatment well    Behavior During Therapy  [facility] for tasks assessed/performed       History reviewed. No pertinent past medical history.  History reviewed. No pertinent surgical history.  There were no vitals filed for this visit.  Subjective Assessment - 01/03/18 1018    Subjective  Pt reports he is no longer using cane, except for long distances for balance.  He reports the Lt knee has been a little easier than Rt one.     Patient Stated Goals  get knee moving and strong; walk without walker     Currently in Pain?  Yes    Pain Score  3     Pain Location  Knee    Pain Orientation  Left    Pain Descriptors / Indicators  Tightness;Aching    Aggravating Factors   prolonged positions    Pain Relieving Factors  ice         [facility] PT Assessment - 01/03/18 0001      Assessment   Medical Diagnosis  Lt TKA     Referring Provider  Dr. Minda Meo    Onset Date/Surgical Date  12/07/17    Hand Dominance  Right    Next MD Visit  PRN      AROM   Left Knee Flexion  100      Flexibility   Quadriceps  Lt knee flexion 94 deg       [facility] Adult PT Treatment/Exercise - 01/03/18 0001      Knee/Hip Exercises: Stretches   Passive Hamstring Stretch  Left;3 reps;30 seconds supine with strap    Quad Stretch  Right;Left;2 reps;30 seconds prone with strap - repeated x 2 with noodle at distal thigh     Hip Flexor Stretch  --  thomas position, Rt knee to chest.     ITB Stretch  Left;3 reps;30 seconds    Gastroc Stretch  Left;3 reps;30 seconds    Other Knee/Hip Stretches  Lt foot on back of truck to stretch Lt knee into flexion x 30 sec x 3 reps       Knee/Hip Exercises: Aerobic   Stationary Bike  Partial then full revolutions for 8 min       Knee/Hip Exercises: Standing   Forward Step Up  Left;1 set;10 reps;Hand Hold: 1;Step Height: 8"    Step Down  Right;1 set;10 reps;Hand Hold: 2;Step Height: 6"    SLS  LLE SLE x 20 sec, repeated on blue pad, 2 reps with occasional UE to steady.     Other Standing Knee Exercises  laps around gym in between exercises to decrease stiffnes.       Product manager Parameters  to tolerance  Electrical Stimulation Goals  Pain;Edema      Vasopneumatic   Number Minutes Vasopneumatic   15 minutes    Vasopnuematic Location   Knee Lt    Vasopneumatic Pressure  Low    Vasopneumatic Temperature   34 deg      Manual Therapy   Soft tissue mobilization  Edge tool assistance to Lt prox / lateral quad to decrease fascial restriction and improve ROM               PT Short Term Goals - 01/03/18 1103      PT SHORT TERM GOAL #1   Title  Increase AROM to (-)4 to 100 deg 01/28/18    Time  6    Period  Weeks    Status  Partially Met      PT SHORT TERM GOAL #2   Title  Patient to ambulate with good gait pattern with SPC for functional distances of at least 10-15 min 01/28/18    Time  6    Period  Weeks    Status  Partially Met      PT SHORT TERM GOAL #3   Title  Independent with initial HEP 01/28/18    Time  6    Period  Weeks    Status  Achieved        PT Long Term Goals - 12/31/17 1033      PT LONG TERM GOAL #1   Title  Increase AROM Rt knee to (-) 2 deg extension to 110 deg flexion 03/11/18    Time  12    Period  Weeks    Status  On-going       PT LONG TERM GOAL #2   Title  5/5/ strength Rt LE 03/11/18    Time  12    Period  Weeks    Status  On-going      PT LONG TERM GOAL #3   Title  Ambulate without assistive device with good gait pattern for functional distances and at least 20-30 min  03/11/18    Time  12    Period  Weeks    Status  On-going      PT LONG TERM GOAL #4   Title  Improve FOTO to </= 45% limitation 03/11/18    Time  12    Period  Weeks    Status  On-going      PT LONG TERM GOAL #5   Title  Independent in HEP 03/11/18    Time  12    Period  Weeks    Status  On-going            Plan - 01/03/18 1106    Clinical Impression Statement  Pt demonstrated improved quad flexibility (improved 24 deg since the 3/14).  He tolerated exercises including increased step height, with min increase in pain. Pt has met STG #3, and partially met STG #1.      Rehab Potential  Good    PT Frequency  2x / week    PT Duration  12 weeks    PT Treatment/Interventions  Patient/family education;ADLs/Self Care Home Management;Cryotherapy;Electrical Stimulation;Iontophoresis 67m/ml Dexamethasone;Moist Heat;Ultrasound;Dry needling;Manual techniques;Neuromuscular re-education;Gait training;Functional mobility training;Therapeutic activities;Therapeutic exercise    PT Next Visit Plan  continue progressive ROM and strengthening exercises for Lt knee. Manual therapy and modalities as indicated.     Consulted and Agree with Plan of Care  Patient       Patient will benefit from skilled therapeutic  intervention in order to improve the following deficits and impairments:  Postural dysfunction, Improper body mechanics, Pain, Increased fascial restricitons, Increased muscle spasms, Decreased mobility, Decreased range of motion, Decreased strength, Decreased activity tolerance, Abnormal gait  Visit Diagnosis: Stiffness of left knee  Acute pain of left knee  Muscle weakness (generalized)     Problem List Patient Active Problem List    Diagnosis Date Noted  . Left lumbar radiculitis 10/23/2017  . Left knee osteoarthritis, post right knee arthroplasty 11/02/2015   [name], PTA 01/03/18 11:11 AM  [place], [place], [address] Phone: [phone]   Fax:  [phone]  Name: Larri Yehle MRN: [id number] Date of Birth: 02-10-1956

## 2018-01-07 ENCOUNTER — Ambulatory Visit: Payer: PRIVATE HEALTH INSURANCE | Admitting: Physical Therapy

## 2018-01-07 DIAGNOSIS — M25662 Stiffness of left knee, not elsewhere classified: Secondary | ICD-10-CM | POA: Diagnosis not present

## 2018-01-07 DIAGNOSIS — M25562 Pain in left knee: Secondary | ICD-10-CM | POA: Diagnosis not present

## 2018-01-07 DIAGNOSIS — M6281 Muscle weakness (generalized): Secondary | ICD-10-CM | POA: Diagnosis not present

## 2018-01-07 NOTE — Therapy (Signed)
[place], [place], [address] Phone: [phone]   Fax:  [phone]  Physical Therapy Treatment  Patient Details  Name: Terry Greene MRN: [id number] Date of Birth: [birth year] Referring Provider: Dr. Minda Meo   Encounter Date: 01/07/2018  PT End of Session - 01/07/18 1102    Visit Number  7    Number of Visits  24 altered per supervising PT, Celyn Holt.     Date for PT Re-Evaluation  03/11/18    PT Start Time  1010    PT Stop Time  1115    PT Time Calculation (min)  65 min    Activity Tolerance  Patient tolerated treatment well    Behavior During Therapy  [facility] for tasks assessed/performed       No past medical history on file.  No past surgical history on file.  There were no vitals filed for this visit.  Subjective Assessment - 01/07/18 1102    Subjective  Pt reports he feels tighter around his knee cap.  He's been up on his feet a lot more lately.  Trying to wean off of pain meds.     Currently in Pain?  Yes    Pain Score  6     Pain Location  Knee    Pain Orientation  Left    Pain Descriptors / Indicators  Tightness;Aching    Aggravating Factors   prolonged positions    Pain Relieving Factors  ice          [facility] PT Assessment - 01/07/18 0001      Assessment   Medical Diagnosis  Lt TKA     Referring Provider  Dr. Minda Meo    Onset Date/Surgical Date  12/07/17    Hand Dominance  Right    Next MD Visit  PRN      AROM   Left Knee Extension  -5    Left Knee Flexion  104        [facility] Adult PT Treatment/Exercise - 01/07/18 0001      Knee/Hip Exercises: Stretches   Passive Hamstring Stretch  Left;60 seconds;2 reps    Quad Stretch  Left;2 reps;30 seconds    Gastroc Stretch  Left;3 reps;30 seconds    Other Knee/Hip Stretches  seated scoot, x 2 reps for measurement.       Knee/Hip Exercises: Aerobic   Stationary Bike  Partial then full revolutions for 8 min  PTA  present to discuss progress      Knee/Hip Exercises: Standing   Lateral Step Up  Left;1 set;10 reps;Step Height: 6";Hand Hold: 2    Forward Step Up  Left;1 set;10 reps;Hand Hold: 2;Step Height: 6" 4 reps on 8" step, unable to tolerate more, switched to 6"    Other Standing Knee Exercises  retro gait on treadmill to work on knee ext (3 min at -0.9 mph)     Other Standing Knee Exercises  tandem stance x 30 sec x 3 reps, horiz head turns on 2nd and 3rd rep.       Knee/Hip Exercises: Prone   Prone Knee Hang  1 minute 3 reps      Electrical Stimulation   Electrical Stimulation Location  Lt knee    Electrical Stimulation Action  ion repelling    Electrical Stimulation Parameters  to tolerance    Electrical Stimulation Goals  Tone;Pain      Vasopneumatic   Number Minutes Vasopneumatic   15 minutes  Vasopnuematic Location   Knee Lt    Vasopneumatic Pressure  Low    Vasopneumatic Temperature   34 deg      Manual Therapy   Manual Therapy  Passive ROM    Passive ROM  overpressure into Lt knee ext                PT Short Term Goals - 01/03/18 1103      PT SHORT TERM GOAL #1   Title  Increase AROM to (-)4 to 100 deg 01/28/18    Time  6    Period  Weeks    Status  Partially Met      PT SHORT TERM GOAL #2   Title  Patient to ambulate with good gait pattern with SPC for functional distances of at least 10-15 min 01/28/18    Time  6    Period  Weeks    Status  Partially Met      PT SHORT TERM GOAL #3   Title  Independent with initial HEP 01/28/18    Time  6    Period  Weeks    Status  Achieved        PT Long Term Goals - 12/31/17 1033      PT LONG TERM GOAL #1   Title  Increase AROM Rt knee to (-) 2 deg extension to 110 deg flexion 03/11/18    Time  12    Period  Weeks    Status  On-going      PT LONG TERM GOAL #2   Title  5/5/ strength Rt LE 03/11/18    Time  12    Period  Weeks    Status  On-going      PT LONG TERM GOAL #3   Title  Ambulate without assistive  device with good gait pattern for functional distances and at least 20-30 min  03/11/18    Time  12    Period  Weeks    Status  On-going      PT LONG TERM GOAL #4   Title  Improve FOTO to </= 45% limitation 03/11/18    Time  12    Period  Weeks    Status  On-going      PT LONG TERM GOAL #5   Title  Independent in HEP 03/11/18    Time  12    Period  Weeks    Status  On-going            Plan - 01/07/18 1323    Clinical Impression Statement  Pt unable to tolerate 8" step ups today due to increase pain.  Pt reporting increased level of pain and tightness, likely due to increased activity level.  Despite this, pt continues to make steady progress towards goals.     Rehab Potential  Good    PT Frequency  2x / week    PT Duration  12 weeks    PT Treatment/Interventions  Patient/family education;ADLs/Self Care Home Management;Cryotherapy;Electrical Stimulation;Iontophoresis 28m/ml Dexamethasone;Moist Heat;Ultrasound;Dry needling;Manual techniques;Neuromuscular re-education;Gait training;Functional mobility training;Therapeutic activities;Therapeutic exercise    PT Next Visit Plan  continue progressive ROM and strengthening exercises for Lt knee. Manual therapy and modalities as indicated.     Consulted and Agree with Plan of Care  Patient       Patient will benefit from skilled therapeutic intervention in order to improve the following deficits and impairments:  Postural dysfunction, Improper body mechanics, Pain, Increased fascial restricitons, Increased muscle spasms,  Decreased mobility, Decreased range of motion, Decreased strength, Decreased activity tolerance, Abnormal gait  Visit Diagnosis: Stiffness of left knee  Acute pain of left knee  Muscle weakness (generalized)     Problem List Patient Active Problem List   Diagnosis Date Noted  . Left lumbar radiculitis 10/23/2017  . Left knee osteoarthritis, post right knee arthroplasty 11/02/2015   [name],  PTA 01/07/18 1:25 PM  [place], [place], [address] Phone: [phone]   Fax:  [phone]  Name: Terry Greene MRN: [id number] Date of Birth: 07/13/1956

## 2018-01-10 ENCOUNTER — Ambulatory Visit: Payer: PRIVATE HEALTH INSURANCE | Admitting: Physical Therapy

## 2018-01-10 DIAGNOSIS — M25562 Pain in left knee: Secondary | ICD-10-CM

## 2018-01-10 DIAGNOSIS — M6281 Muscle weakness (generalized): Secondary | ICD-10-CM | POA: Diagnosis not present

## 2018-01-10 DIAGNOSIS — M25662 Stiffness of left knee, not elsewhere classified: Secondary | ICD-10-CM

## 2018-01-10 DIAGNOSIS — R2689 Other abnormalities of gait and mobility: Secondary | ICD-10-CM | POA: Diagnosis not present

## 2018-01-10 NOTE — Therapy (Addendum)
[place], [place], [address] Phone: [phone]   Fax:  [phone]  Physical Therapy Treatment  Patient Details  Name: Terry Greene MRN: [id number] Date of Birth: [birth year] Referring Provider: Dr. Minda Meo    Encounter Date: 01/10/2018  PT End of Session - 01/10/18 1103    Visit Number  8    Number of Visits  24    Date for PT Re-Evaluation  03/11/18    PT Start Time  8756    PT Stop Time  1110    PT Time Calculation (min)  55 min    Activity Tolerance  Patient tolerated treatment well    Behavior During Therapy  [facility] for tasks assessed/performed       No past medical history on file.  No past surgical history on file.  There were no vitals filed for this visit.  Subjective Assessment - 01/10/18 1016    Subjective  Pt states that pain has improved since his last visit. He has not been wearing his compression sock as often and has continued to wean himself off of pain meds.    Currently in Pain?  Yes    Pain Score  5  2/10 at rest    Pain Location  Knee    Pain Orientation  Left;Anterior;Lower;Lateral    Pain Descriptors / Indicators  Stabbing    Aggravating Factors   stairs     Pain Relieving Factors  ice, medication         [facility] PT Assessment - 01/10/18 0001      Assessment   Medical Diagnosis  Lt TKA     Referring Provider  Dr. Minda Meo     Onset Date/Surgical Date  12/07/17    Hand Dominance  Right    Next MD Visit  PRN      ROM / Strength   AROM / PROM / Strength  PROM      AROM   Left Knee Flexion  105 during seated scoot      PROM   PROM Assessment Site  Knee    Right/Left Knee  Left    Left Knee Extension  -5 with overpressure      Flexibility   Quadriceps  Lt knee flexion 94 deg         [facility] Adult PT Treatment/Exercise - 01/10/18 0001      Knee/Hip Exercises: Stretches   Sports administrator  Left;60 seconds;3 reps    Gastroc Stretch  Left;Right;30  seconds;3 reps      Knee/Hip Exercises: Aerobic   Stationary Bike  Full revolutions at slow pace x 8 min, PTA present to discuss progress.       Knee/Hip Exercises: Standing   Forward Step Up  Left;1 set;Hand Hold: 2;Step Height: 6";5 reps and 20 reps on 8" step, tape applied around rep 10.     Stairs  reciprocal pattern ascending/descending over 4 and 6" step, 15 steps total.       Knee/Hip Exercises: Seated   Other Seated Knee/Hip Exercises  seated scoots x 10 sec hold x 8 reps      Electrical Stimulation   Electrical Stimulation Location  Lt knee    Electrical Stimulation Action  IFC    Electrical Stimulation Parameters  to tolerance    Electrical Stimulation Goals  Tone;Pain      Vasopneumatic   Number Minutes Vasopneumatic   15 minutes    Vasopnuematic Location  Knee Lt    Vasopneumatic Pressure  Low      Manual Therapy   Manual Therapy  Taping    Manual therapy comments  I strip of Rock tape applied to Lt knee incision in zigzag pattern with 25% stretch to assist in scar management.  2- I strips of Rock tape applied to lateral portion of Lt knee to decompress tissue and decrease pain (75% stretch- in X pattern)    Soft tissue mobilization  Edge tool assistance to Lt prox / lateral quad to decrease fascial restriction and improve ROM    Passive ROM  overpressure into Lt knee ext                PT Short Term Goals - 01/03/18 1103      PT SHORT TERM GOAL #1   Title  Increase AROM to (-)4 to 100 deg 01/28/18    Time  6    Period  Weeks    Status  Partially Met      PT SHORT TERM GOAL #2   Title  Patient to ambulate with good gait pattern with SPC for functional distances of at least 10-15 min 01/28/18    Time  6    Period  Weeks    Status  Partially Met      PT SHORT TERM GOAL #3   Title  Independent with initial HEP 01/28/18    Time  6    Period  Weeks    Status  Achieved        PT Long Term Goals - 01/10/18 1238      PT LONG TERM GOAL #1   Title   Increase AROM Lt knee to (-) 2 deg extension to 110 deg flexion 03/11/18    Time  12    Period  Weeks    Status  On-going      PT LONG TERM GOAL #2   Title  5/5/ strength Lt LE 03/11/18    Time  12    Period  Weeks    Status  On-going      PT LONG TERM GOAL #3   Title  Ambulate without assistive device with good gait pattern for functional distances and at least 20-30 min  03/11/18    Time  12    Period  Weeks    Status  Partially Met ambulating at least 20-30 min, however gait deviations noted.       PT LONG TERM GOAL #4   Title  Improve FOTO to </= 45% limitation 03/11/18    Time  12    Period  Weeks    Status  On-going      PT LONG TERM GOAL #5   Title  Independent in HEP 03/11/18    Time  12    Period  Weeks    Status  On-going            Plan - 01/10/18 1104    Clinical Impression Statement  Pt's Lt knee ROM similar to last assessment.  Pt reported decreased pain by 50% in Lt lateral knee with forward step ups after application of Rock Tape. He was able to tolerate 2 sets of 10 forward step ups on 8" step with less pain and difficulty. He has partially met LTG #3.     Rehab Potential  Good    PT Frequency  2x / week    PT Duration  12 weeks    PT Treatment/Interventions  Patient/family education;ADLs/Self Care Home Management;Cryotherapy;Electrical Stimulation;Iontophoresis 70m/ml Dexamethasone;Moist Heat;Ultrasound;Dry needling;Manual techniques;Neuromuscular re-education;Gait training;Functional mobility training;Therapeutic activities;Therapeutic exercise    PT Next Visit Plan  continue progressive ROM and strengthening exercises for Lt knee. Manual therapy and modalities as indicated.     Consulted and Agree with Plan of Care  Patient       Patient will benefit from skilled therapeutic intervention in order to improve the following deficits and impairments:  Postural dysfunction, Improper body mechanics, Pain, Increased fascial restricitons, Increased muscle spasms,  Decreased mobility, Decreased range of motion, Decreased strength, Decreased activity tolerance, Abnormal gait  Visit Diagnosis: Stiffness of left knee  Acute pain of left knee  Muscle weakness (generalized)  Other abnormalities of gait and mobility     Problem List Patient Active Problem List   Diagnosis Date Noted  . Left lumbar radiculitis 10/23/2017  . Left knee osteoarthritis, post right knee arthroplasty 11/02/2015   JKerin Perna PTA 01/10/18 1:07 PM  [place] [address]Phone: [phone]  Fax:  [phone] Name: Terry MarkosMRN: [id number] of Birth: 424-Nov-1957

## 2018-01-16 ENCOUNTER — Ambulatory Visit: Payer: PRIVATE HEALTH INSURANCE | Admitting: Physical Therapy

## 2018-01-16 DIAGNOSIS — M6281 Muscle weakness (generalized): Secondary | ICD-10-CM | POA: Diagnosis not present

## 2018-01-16 DIAGNOSIS — M25562 Pain in left knee: Secondary | ICD-10-CM | POA: Diagnosis not present

## 2018-01-16 DIAGNOSIS — M25662 Stiffness of left knee, not elsewhere classified: Secondary | ICD-10-CM

## 2018-01-16 NOTE — Therapy (Signed)
[place], [place], [address] Phone: [phone]   Fax:  [phone]  Physical Therapy Treatment  Patient Details  Name: Terry Greene MRN: [id number] Date of Birth: Feb 16, 1956 Referring Provider: Dr. Minda Meo    Encounter Date: 01/16/2018  PT End of Session - 01/16/18 1053    Visit Number  9    Number of Visits  24    Date for PT Re-Evaluation  03/11/18    PT Start Time  1016    PT Stop Time  1116    PT Time Calculation (min)  60 min    Activity Tolerance  Patient tolerated treatment well    Behavior During Therapy  [facility] for tasks assessed/performed       No past medical history on file.  No past surgical history on file.  There were no vitals filed for this visit.  Subjective Assessment - 01/16/18 1023    Subjective  Pt reports that his pain was improved since last visit and swelling has slightly decreased. He continues to wear his compression sock and wean himself off of pain meds (3 pills left). Sleep has improved, sleeping 5 hours last night without interruption from pain.    Currently in Pain?  Yes    Pain Score  2     Pain Location  Ankle    Pain Orientation  Left    Pain Descriptors / Indicators  Stabbing;Aching    Pain Frequency  Intermittent    Aggravating Factors   going up stairs without R railing    Pain Relieving Factors  ice, medication         [facility] PT Assessment - 01/16/18 0001      Assessment   Medical Diagnosis  Lt TKA     Referring Provider  -- Dr. Minda Meo    Onset Date/Surgical Date  12/07/17    Hand Dominance  Right    Next MD Visit  PRN      ROM / Strength   AROM / PROM / Strength  AROM;Strength      AROM   Left Knee Flexion  108 during seated scoot      Strength   Strength Assessment Site  Hip;Knee    Right/Left Hip  Left    Left Hip Flexion  5/5    Left Hip Extension  4+/5    Left Hip ABduction  4+/5    Left Hip ADduction  4+/5    Left Knee Flexion  -- 5-/5    Left Knee Extension  4+/5      Flexibility   Quadriceps  Lt knee flexion 104 deg          [facility] Adult PT Treatment/Exercise - 01/16/18 0001      Knee/Hip Exercises: Stretches   Passive Hamstring Stretch  Left;60 seconds;2 reps    Quad Stretch  Left;60 seconds;3 reps    Gastroc Stretch  Right;Left;2 reps;60 seconds      Knee/Hip Exercises: Aerobic   Stationary Bike  Ful slow revolutions, for 4 min, then L2 x 3 min  PTA present to discuss progress    Elliptical  L1 x 4      Knee/Hip Exercises: Machines for Strengthening   Cybex Knee Extension  2 sets of 10, 2 plates - BLE up, LLE down.     Total Gym Leg Press  6 Plates with LLE x 10 reps, 2 sets; 8 plates x 10 reps; heel raises with 8  plates x 10 reps      Knee/Hip Exercises: Seated   Other Seated Knee/Hip Exercises  seated scoots x 10 sec hold x 5 reps      Electrical Stimulation   Electrical Stimulation Location  Lt knee    Electrical Stimulation Action  IFC    Electrical Stimulation Parameters  to tolerance    Electrical Stimulation Goals  Pain      Vasopneumatic   Number Minutes Vasopneumatic   15 minutes    Vasopnuematic Location   Knee Lt    Vasopneumatic Pressure  Low    Vasopneumatic Temperature   34 deg      Manual Therapy   Manual therapy comments  I strip of Rock tape applied to Lt knee incision in zigzag pattern with 25% stretch to assist in scar management.  2- I strips of Rock tape applied to lateral portion of Lt knee to decompress tissue and decrease pain (75% stretch- in X pattern)               PT Short Term Goals - 01/03/18 1103      PT SHORT TERM GOAL #1   Title  Increase AROM to (-)4 to 100 deg 01/28/18    Time  6    Period  Weeks    Status  Partially Met      PT SHORT TERM GOAL #2   Title  Patient to ambulate with good gait pattern with SPC for functional distances of at least 10-15 min 01/28/18    Time  6    Period  Weeks    Status  Partially Met       PT SHORT TERM GOAL #3   Title  Independent with initial HEP 01/28/18    Time  6    Period  Weeks    Status  Achieved        PT Long Term Goals - 01/16/18 1040      PT LONG TERM GOAL #1   Title  Increase AROM Lt knee to (-) 2 deg extension to 110 deg flexion 03/11/18    Time  12    Period  Weeks    Status  On-going      PT LONG TERM GOAL #2   Title  5/5/ strength Lt LE 03/11/18    Time  12    Period  Weeks    Status  Partially Met      PT LONG TERM GOAL #3   Title  Ambulate without assistive device with good gait pattern for functional distances and at least 20-30 min  03/11/18    Time  12    Period  Weeks    Status  Partially Met      PT LONG TERM GOAL #4   Title  Improve FOTO to </= 45% limitation 03/11/18    Time  12    Period  Weeks    Status  On-going      PT LONG TERM GOAL #5   Title  Independent in HEP 03/11/18    Time  12    Period  Weeks    Status  On-going            Plan - 01/16/18 1306    Clinical Impression Statement  Pt demonstrated improved Lt knee flexion ROM and LLE strength; has partially met LTG#2.  Pt tolerated new exercises well, including eliptical and Nautilus strength machines; this will assist in his return to the gym. Progressing  well towards goals.     Rehab Potential  Good    PT Frequency  2x / week    PT Duration  12 weeks    PT Treatment/Interventions  Patient/family education;ADLs/Self Care Home Management;Cryotherapy;Electrical Stimulation;Iontophoresis 73m/ml Dexamethasone;Moist Heat;Ultrasound;Dry needling;Manual techniques;Neuromuscular re-education;Gait training;Functional mobility training;Therapeutic activities;Therapeutic exercise    PT Next Visit Plan  continue progressive ROM and strengthening exercises for Lt knee. Manual therapy and modalities as indicated.     Consulted and Agree with Plan of Care  Patient       Patient will benefit from skilled therapeutic intervention in order to improve the following deficits and  impairments:  Postural dysfunction, Improper body mechanics, Pain, Increased fascial restricitons, Increased muscle spasms, Decreased mobility, Decreased range of motion, Decreased strength, Decreased activity tolerance, Abnormal gait  Visit Diagnosis: Stiffness of left knee  Acute pain of left knee  Muscle weakness (generalized)     Problem List Patient Active Problem List   Diagnosis Date Noted  . Left lumbar radiculitis 10/23/2017  . Left knee osteoarthritis, post right knee arthroplasty 11/02/2015   JKerin Perna PTA 01/16/18 1:11 PM  [place] [address]Phone: [phone]  Fax:  [phone] Name: MLyndle PangMRN: [id number] of Birth: 406-30-1957

## 2018-01-18 ENCOUNTER — Encounter: Payer: Self-pay | Admitting: Physical Therapy

## 2018-01-18 ENCOUNTER — Ambulatory Visit: Payer: PRIVATE HEALTH INSURANCE | Admitting: Physical Therapy

## 2018-01-18 DIAGNOSIS — M25562 Pain in left knee: Secondary | ICD-10-CM | POA: Diagnosis not present

## 2018-01-18 DIAGNOSIS — M25662 Stiffness of left knee, not elsewhere classified: Secondary | ICD-10-CM | POA: Diagnosis not present

## 2018-01-18 DIAGNOSIS — M6281 Muscle weakness (generalized): Secondary | ICD-10-CM

## 2018-01-18 NOTE — Therapy (Signed)
[place], [place], [address] Phone: [phone]   Fax:  [phone]  Physical Therapy Treatment  Patient Details  Name: Terry Greene MRN: [id number] Date of Birth: [birth year] Referring Provider: Dr. Minda Meo   Encounter Date: 01/18/2018  PT End of Session - 01/18/18 1414    Visit Number  10    Number of Visits  24    Date for PT Re-Evaluation  03/11/18    PT Start Time  1017    PT Stop Time  1114    PT Time Calculation (min)  57 min    Activity Tolerance  Patient tolerated treatment well;No increased pain    Behavior During Therapy  [facility] for tasks assessed/performed       History reviewed. No pertinent past medical history.  History reviewed. No pertinent surgical history.  There were no vitals filed for this visit.  Subjective Assessment - 01/18/18 1411    Subjective  Pt reports he was working out in his yard for a long time yesterday.  Everything is sore.  His knee and body feel stiff today.      Patient Stated Goals  get knee moving and strong; walk without walker     Currently in Pain?  Yes    Pain Score  4     Pain Location  Knee    Pain Descriptors / Indicators  Aching;Tightness    Aggravating Factors   deep bends in knee, standing for prolonged periods     Pain Relieving Factors  ice, medication          [facility] PT Assessment - 01/18/18 0001      Assessment   Medical Diagnosis  Lt TKA     Referring Provider  Dr. Minda Meo    Onset Date/Surgical Date  12/07/17    Hand Dominance  Right    Next MD Visit  PRN      AROM   Left Knee Extension  -5 supine quad set    Left Knee Flexion  108 during seated scoot                   [facility] Adult PT Treatment/Exercise - 01/18/18 0001      Knee/Hip Exercises: Stretches   Quad Stretch  Left;3 reps;30 seconds foot on 13" step, with leaning forward.     Gastroc Stretch  Right;Left;3 reps;30 seconds    Other Knee/Hip  Stretches  Lt foot on back of truck to increase Lt knee flexion, x 8 reps.       Knee/Hip Exercises: Aerobic   Elliptical  trial; compensatory movement in Rt hip; stopped.     Nustep  L5: 5.5 min legs only PTA present to discuss progress      Knee/Hip Exercises: Machines for Strengthening   Cybex Knee Extension  1 set of 10 reps LLE with 1 plate. 1 set of 10, 2 plates - BLE up, LLE down.     Total Gym Leg Press  8 plates with LLE x 10 reps      Modalities   Modalities  Electrical Stimulation;Moist Heat;Cryotherapy      Moist Heat Therapy   Number Minutes Moist Heat  15 Minutes    Moist Heat Location  Knee Lt posterior      Cryotherapy   Number Minutes Cryotherapy  15 Minutes    Cryotherapy Location  Knee Lt anterior    Type of Cryotherapy  Ice pack  Armed forces training and education officer  IFC    Electrical Stimulation Parameters  to tolerance     Electrical Stimulation Goals  Pain      Manual Therapy   Manual therapy comments  -- tape in place from last visit    Soft tissue mobilization  STM and muscle stripping to Lt hamstring and prox calf to decrease fascial tightness and improve ROM      laps around gym in between exercises to decrease stiffness in knee.   Prone hang with foot off table during manual therapy to increase knee ext.  Quad set for LLE with pt in supine x 10 sec x 10 reps          PT Short Term Goals - 01/03/18 1103      PT SHORT TERM GOAL #1   Title  Increase AROM to (-)4 to 100 deg 01/28/18    Time  6    Period  Weeks    Status  Partially Met      PT SHORT TERM GOAL #2   Title  Patient to ambulate with good gait pattern with SPC for functional distances of at least 10-15 min 01/28/18    Time  6    Period  Weeks    Status  Partially Met      PT SHORT TERM GOAL #3   Title  Independent with initial HEP 01/28/18    Time  6    Period  Weeks    Status  Achieved        PT Long  Term Goals - 01/16/18 1040      PT LONG TERM GOAL #1   Title  Increase AROM Lt knee to (-) 2 deg extension to 110 deg flexion 03/11/18    Time  12    Period  Weeks    Status  On-going      PT LONG TERM GOAL #2   Title  5/5/ strength Lt LE 03/11/18    Time  12    Period  Weeks    Status  Partially Met      PT LONG TERM GOAL #3   Title  Ambulate without assistive device with good gait pattern for functional distances and at least 20-30 min  03/11/18    Time  12    Period  Weeks    Status  Partially Met      PT LONG TERM GOAL #4   Title  Improve FOTO to </= 45% limitation 03/11/18    Time  12    Period  Weeks    Status  On-going      PT LONG TERM GOAL #5   Title  Independent in HEP 03/11/18    Time  12    Period  Weeks    Status  On-going            Plan - 01/18/18 1412    Clinical Impression Statement  Pt's ROM in Lt knee similar to last assessment, despite report of tightness in LE.  Pt tolerated all exercises well and reported decreased knee tightness after manual therapy.  Progressing well towards remaining goals.     Rehab Potential  Good    PT Frequency  2x / week    PT Duration  12 weeks    PT Treatment/Interventions  Patient/family education;ADLs/Self Care Home Management;Cryotherapy;Electrical Stimulation;Iontophoresis 12m/ml Dexamethasone;Moist Heat;Ultrasound;Dry needling;Manual techniques;Neuromuscular re-education;Gait training;Functional mobility training;Therapeutic activities;Therapeutic  exercise    PT Next Visit Plan  continue progressive ROM and strengthening exercises for Lt knee. Manual therapy and modalities as indicated. FOTO.     Consulted and Agree with Plan of Care  Patient       Patient will benefit from skilled therapeutic intervention in order to improve the following deficits and impairments:  Postural dysfunction, Improper body mechanics, Pain, Increased fascial restricitons, Increased muscle spasms, Decreased mobility, Decreased range of motion,  Decreased strength, Decreased activity tolerance, Abnormal gait  Visit Diagnosis: Stiffness of left knee  Acute pain of left knee  Muscle weakness (generalized)     Problem List Patient Active Problem List   Diagnosis Date Noted  . Left lumbar radiculitis 10/23/2017  . Left knee osteoarthritis, post right knee arthroplasty 11/02/2015   [name], PTA 01/18/18 2:19 PM  [place], [place], [address] Phone: [phone]   Fax:  [phone]  Name: Terry Greene MRN: [id number] Date of Birth: May 13, 1956

## 2018-01-22 ENCOUNTER — Encounter: Payer: Self-pay | Admitting: Physical Therapy

## 2018-01-22 ENCOUNTER — Ambulatory Visit (INDEPENDENT_AMBULATORY_CARE_PROVIDER_SITE_OTHER): Payer: PRIVATE HEALTH INSURANCE | Admitting: Physical Therapy

## 2018-01-22 DIAGNOSIS — M25662 Stiffness of left knee, not elsewhere classified: Secondary | ICD-10-CM | POA: Diagnosis not present

## 2018-01-22 DIAGNOSIS — M6281 Muscle weakness (generalized): Secondary | ICD-10-CM | POA: Diagnosis not present

## 2018-01-22 DIAGNOSIS — R2689 Other abnormalities of gait and mobility: Secondary | ICD-10-CM

## 2018-01-22 DIAGNOSIS — M25562 Pain in left knee: Secondary | ICD-10-CM | POA: Diagnosis not present

## 2018-01-22 NOTE — Therapy (Addendum)
[facility] [place], [place], [address] Phone: [phone]   Fax:  [phone]  Physical Therapy Treatment  Patient Details  Name: Terry Greene MRN: [id number] Date of Birth: 09-Oct-1956 Referring Provider: Dr. Minda Meo   Encounter Date: 01/22/2018  PT End of Session - 01/22/18 0848    Visit Number  11    Number of Visits  24    Date for PT Re-Evaluation  03/11/18    PT Start Time  0847    PT Stop Time  0945    PT Time Calculation (min)  58 min    Activity Tolerance  Patient tolerated treatment well       History reviewed. No pertinent past medical history.  History reviewed. No pertinent surgical history.  There were no vitals filed for this visit.  Subjective Assessment - 01/22/18 0848    Subjective  Terry Greene states he is having deep pain in the Lt lateral tib plataue area. Plan to go back to work in a week and half.     Patient Stated Goals  get knee moving and strong; walk without walker     Currently in Pain?  Yes    Pain Score  2     Pain Location  Knee    Pain Orientation  Left;Lateral    Pain Descriptors / Indicators  Sharp;Aching    Pain Type  Surgical pain    Pain Onset  1 to 4 weeks ago    Pain Frequency  Intermittent    Aggravating Factors   extended  standing    Pain Relieving Factors  ice and meds         [facility] PT Assessment - 01/22/18 0001      Assessment   Medical Diagnosis  Lt TKA       AROM   Left Knee Extension  -3    Left Knee Flexion  110 standing foot on step, 115 seated, supine 108                   [facility] Adult PT Treatment/Exercise - 01/22/18 0001      Knee/Hip Exercises: Stretches   ITB Stretch  Left;3 reps 45 sec cross body with strap    Gastroc Stretch  Both;30 seconds      Knee/Hip Exercises: Aerobic   Nustep  L2x5'      Knee/Hip Exercises: Standing   Forward Step Up  Left 30 reps stetch into flex on 13" step.      Knee/Hip Exercises:  Seated   Long Arc Quad  Left;10 reps to loosen up leg.       Knee/Hip Exercises: Sidelying   Other Sidelying Knee/Hip Exercises  10 reps each, pilates FWD/BWD kicks, CW/CCW circles, attempted bicycles hurt in lateral tib plat so stopped      Modalities   Modalities  Electrical Stimulation;Moist Heat;Cryotherapy      Moist Heat Therapy   Number Minutes Moist Heat  15 Minutes    Moist Heat Location  Knee posterior      Cryotherapy   Number Minutes Cryotherapy  15 Minutes    Cryotherapy Location  Knee anterior    Type of Cryotherapy  Ice pack      Electrical Stimulation   Electrical Stimulation Location  Lt knee    Electrical Stimulation Action  ion repelling    Electrical Stimulation Parameters  to tolerance    Electrical Stimulation Goals  Pain;Edema  Manual Therapy   Manual Therapy  Taping    Kinesiotex  Create Space      Kinesiotix   Create Space  X with 25% stretch over Lt lateral tib platue             PT Education - 01/22/18 0900    Education provided  Yes    Education Details  ITB stretch    Person(s) Educated  Patient    Methods  Explanation;Handout    Comprehension  Returned demonstration       PT Short Term Goals - 01/22/18 0921      PT SHORT TERM GOAL #1   Title  Increase AROM to (-)4 to 100 deg 01/28/18    Status  Achieved      PT SHORT TERM GOAL #2   Title  Patient to ambulate with good gait pattern with SPC for functional distances of at least 10-15 min 01/28/18    Status  Achieved      PT SHORT TERM GOAL #3   Title  Independent with initial HEP 01/28/18    Status  Achieved        PT Long Term Goals - 01/22/18 0921      PT LONG TERM GOAL #1   Title  Increase AROM Lt knee to (-) 2 deg extension to 110 deg flexion 03/11/18    Status  Partially Met      PT LONG TERM GOAL #2   Title  5/5/ strength Lt LE 03/11/18    Status  Partially Met      PT LONG TERM GOAL #3   Title  Ambulate without assistive device with good gait pattern for  functional distances and at least 20-30 min  03/11/18    Status  Achieved      PT LONG TERM GOAL #4   Title  Improve FOTO to </= 45% limitation 03/11/18    Status  On-going      PT LONG TERM GOAL #5   Title  Independent in HEP 03/11/18    Status  On-going            Plan - 01/22/18 0930    Clinical Impression Statement  Terry Greene with tightness in the Lt lateral thigh this could be attributing to some of his knee pain.  Tolerated stetching there well. He is making progress with knee ROM, has met all STGs now and some of his LTGs.      Rehab Potential  Good    PT Treatment/Interventions  Patient/family education;ADLs/Self Care Home Management;Cryotherapy;Electrical Stimulation;Iontophoresis '4mg'$ /ml Dexamethasone;Moist Heat;Ultrasound;Dry needling;Manual techniques;Neuromuscular re-education;Gait training;Functional mobility training;Therapeutic activities;Therapeutic exercise    PT Next Visit Plan  knee ROM, flexibility and strengthening    Consulted and Agree with Plan of Care  Patient       Patient will benefit from skilled therapeutic intervention in order to improve the following deficits and impairments:  Postural dysfunction, Improper body mechanics, Pain, Increased fascial restricitons, Increased muscle spasms, Decreased mobility, Decreased range of motion, Decreased strength, Decreased activity tolerance, Abnormal gait  Visit Diagnosis: Stiffness of left knee  Acute pain of left knee  Muscle weakness (generalized)  Other abnormalities of gait and mobility  Stiffness of left knee, not elsewhere classified     Problem List Patient Active Problem List   Diagnosis Date Noted  . Left lumbar radiculitis 10/23/2017  . Left knee osteoarthritis, post right knee arthroplasty 11/02/2015    Boneta Lucks rPT  01/22/2018, 9:54 AM  [facility] [place], [place], [address] Phone: [phone]   Fax:   [phone]  Name: Terry Greene MRN: [id number] Date of Birth: [birth year]

## 2018-01-22 NOTE — Patient Instructions (Addendum)
Outer Hip Stretch: Reclined IT Band Stretch (Strap)    Strap around opposite foot, pull across only as far as possible with shoulders on mat. Hold for __45__ secs. Repeat __3__ times each leg.

## 2018-01-24 ENCOUNTER — Encounter: Payer: Self-pay | Admitting: Rehabilitative and Restorative Service Providers"

## 2018-01-24 ENCOUNTER — Ambulatory Visit: Payer: PRIVATE HEALTH INSURANCE | Admitting: Rehabilitative and Restorative Service Providers"

## 2018-01-24 DIAGNOSIS — M6281 Muscle weakness (generalized): Secondary | ICD-10-CM | POA: Diagnosis not present

## 2018-01-24 DIAGNOSIS — M25662 Stiffness of left knee, not elsewhere classified: Secondary | ICD-10-CM | POA: Diagnosis not present

## 2018-01-24 DIAGNOSIS — M25562 Pain in left knee: Secondary | ICD-10-CM

## 2018-01-24 NOTE — Therapy (Signed)
[place], [place], [address] Phone: [phone]   Fax:  [phone]  Physical Therapy Treatment  Patient Details  Name: Terry Greene MRN: [id number] Date of Birth: 1956/02/25 Referring Provider: Dr. Minda Meo   Encounter Date: 01/24/2018  PT End of Session - 01/24/18 0921    Visit Number  12    Number of Visits  24    Date for PT Re-Evaluation  03/11/18    PT Start Time  0841    PT Stop Time  0943    PT Time Calculation (min)  62 min    Activity Tolerance  Patient tolerated treatment well       History reviewed. No pertinent past medical history.  History reviewed. No pertinent surgical history.  There were no vitals filed for this visit.  Subjective Assessment - 01/24/18 0921    Subjective  Pt reports he had no pain in Lt knee this morning when he woke up. As soon as he bent his knee to put his shoe on, the pain in (lateral) knee returned. No pain following treatment today.     Patient Stated Goals  get knee moving and strong; walk without walker     Currently in Pain?  Yes    Pain Score  4     Pain Location  Knee    Pain Orientation  Left;Lateral    Pain Descriptors / Indicators  Sharp    Pain Type  Surgical pain    Pain Onset  More than a month ago    Pain Frequency  Intermittent    Aggravating Factors   bending knee    Pain Relieving Factors  ice, meds, tape         [facility] PT Assessment - 01/24/18 0001      Assessment   Medical Diagnosis  Lt TKA     Onset Date/Surgical Date  12/07/17    Hand Dominance  Right    Next MD Visit  PRN      Palpation   Patella mobility  limited patellar mobility - Lt with patella tracking laterally - with pt c/o pain and discomfort with long arc quad; crepitus noted     Palpation comment  significant tightness noted through the lateral thigh and lateral quad into the patellar area                    [facility] Adult PT  Treatment/Exercise - 01/24/18 0001      Knee/Hip Exercises: Stretches   Sports administrator  Left;30 seconds;5 reps added 3 inch foam roll for last 2 stretches     Gastroc Stretch  Right;Left;5 reps;30 seconds      Knee/Hip Exercises: Aerobic   Nustep  L2 x 6 min       Knee/Hip Exercises: Standing   Forward Step Up  Left;1 set;15 reps;Hand Hold: 1;Step Height: 8" eccentric return      Acupuncturist Location  Lt knee    Electrical Stimulation Action  IFC    Electrical Stimulation Parameters  to tolerance    Electrical Stimulation Goals  Pain;Edema      Ultrasound   Ultrasound Location  lateral distal thigh through the lateral patellar area    Ultrasound Parameters  1.5 w/cm2; 1 mHz; 100%; 8 min     Ultrasound Goals  Pain;Other (Comment) tightness       Vasopneumatic   Number Minutes Vasopneumatic   15 minutes  Vasopnuematic Location   Knee Lt     Vasopneumatic Pressure  Low    Vasopneumatic Temperature   34 deg      Manual Therapy   Manual therapy comments  pt supine LE's supported on bolster     Soft tissue mobilization  deep tissue work and instrument assisted soft tissue work through the lateral quad/ITB distally and lateral patella    Passive ROM  patellar mobilitzation laterally to medially and distally               PT Short Term Goals - 01/22/18 0921      PT SHORT TERM GOAL #1   Title  Increase AROM to (-)4 to 100 deg 01/28/18    Status  Achieved      PT SHORT TERM GOAL #2   Title  Patient to ambulate with good gait pattern with SPC for functional distances of at least 10-15 min 01/28/18    Status  Achieved      PT SHORT TERM GOAL #3   Title  Independent with initial HEP 01/28/18    Status  Achieved        PT Long Term Goals - 01/22/18 0921      PT LONG TERM GOAL #1   Title  Increase AROM Lt knee to (-) 2 deg extension to 110 deg flexion 03/11/18    Status  Partially Met      PT LONG TERM GOAL #2   Title  5/5/ strength  Lt LE 03/11/18    Status  Partially Met      PT LONG TERM GOAL #3   Title  Ambulate without assistive device with good gait pattern for functional distances and at least 20-30 min  03/11/18    Status  Achieved      PT LONG TERM GOAL #4   Title  Improve FOTO to </= 45% limitation 03/11/18    Status  On-going      PT LONG TERM GOAL #5   Title  Independent in HEP 03/11/18    Status  On-going            Plan - 01/24/18 1250    Clinical Impression Statement  Terry Greene presents today with c/o pain and increased tightness through the Lt knee laterally as well as crepitus with extensioin of the left knee. He has significant tightness through the ITB and lateral quad distally at lateral patella. Symptoms responded well to [place] followed by manual work and quad stretch in prone(foam roll at distal thigh). Pain, tightness, and crepitus were all improved following treatment. Patient will continue to work on self massage and begin scar massage as well as patellar mobilization - and continue with quad stretch in prone.     Rehab Potential  Good    PT Frequency  2x / week    PT Duration  12 weeks    PT Treatment/Interventions  Patient/family education;ADLs/Self Care Home Management;Cryotherapy;Electrical Stimulation;Iontophoresis 81m/ml Dexamethasone;Moist Heat;Ultrasound;Dry needling;Manual techniques;Neuromuscular re-education;Gait training;Functional mobility training;Therapeutic activities;Therapeutic exercise    PT Next Visit Plan  knee ROM, flexibility and strengthening - address patellar mobility; work on scar massage; stretch quad; modalities and deep tissue work through tSports administratorwith Plan of Care  Patient       Patient will benefit from skilled therapeutic intervention in order to improve the following deficits and impairments:  Postural dysfunction, Improper body mechanics, Pain, Increased fascial restricitons, Increased muscle spasms, Decreased mobility, Decreased range of motion,  Decreased strength, Decreased activity tolerance, Abnormal gait  Visit Diagnosis: Stiffness of left knee  Acute pain of left knee  Muscle weakness (generalized)     Problem List Patient Active Problem List   Diagnosis Date Noted  . Left lumbar radiculitis 10/23/2017  . Left knee osteoarthritis, post right knee arthroplasty 11/02/2015    Celyn Nilda Simmer PT, MPH  01/24/2018, 12:55 PM  [facility] [place], [place], [address] Phone: [phone]   Fax:  [phone]  Name: Terry Greene MRN: [id number] Date of Birth: 11-Feb-1956

## 2018-01-28 ENCOUNTER — Ambulatory Visit: Payer: PRIVATE HEALTH INSURANCE | Admitting: Physical Therapy

## 2018-01-28 DIAGNOSIS — M25562 Pain in left knee: Secondary | ICD-10-CM

## 2018-01-28 DIAGNOSIS — M6281 Muscle weakness (generalized): Secondary | ICD-10-CM | POA: Diagnosis not present

## 2018-01-28 DIAGNOSIS — M25662 Stiffness of left knee, not elsewhere classified: Secondary | ICD-10-CM | POA: Diagnosis not present

## 2018-01-28 DIAGNOSIS — R2689 Other abnormalities of gait and mobility: Secondary | ICD-10-CM | POA: Diagnosis not present

## 2018-01-28 NOTE — Therapy (Signed)
[place], [place], [address] Phone: [phone]   Fax:  [phone]  Physical Therapy Treatment  Patient Details  Name: Terry Greene MRN: [id number] Date of Birth: 1956/05/15 Referring Provider: Dr. Minda Meo   Encounter Date: 01/28/2018  PT End of Session - 01/28/18 1015    Visit Number  13    Number of Visits  24    Date for PT Re-Evaluation  03/11/18    PT Start Time  1017    PT Stop Time  1113    PT Time Calculation (min)  56 min    Activity Tolerance  Patient tolerated treatment well    Behavior During Therapy  [facility] for tasks assessed/performed       No past medical history on file.  No past surgical history on file.  There were no vitals filed for this visit.  Subjective Assessment - 01/28/18 1018    Subjective  Terry Greene says his knee pain seems to hurt wrose for 30 minutes when doing prone flexion with strap, then subsides. Clicking in L knee was reduced after last treatment, but resumed the day after.    Currently in Pain?  Yes    Pain Score  3     Pain Location  Knee    Pain Orientation  Left;Lateral    Pain Descriptors / Indicators  Sharp    Aggravating Factors   bending knee    Pain Relieving Factors  ice, meds, tape         [facility] PT Assessment - 01/28/18 0001      Assessment   Medical Diagnosis  L TKA    Referring Provider  Dr. Minda Meo    Onset Date/Surgical Date  12/07/17    Hand Dominance  Right    Next MD Visit  PRN      AROM   Left Knee Flexion  --      PROM   Right/Left Knee  Left    Left Knee Flexion  112 pt standing with LLE on high-low table, hips @ 90      Flexibility   Quadriceps  L knee flexion 108        [facility] Adult PT Treatment/Exercise - 01/28/18 0001      Knee/Hip Exercises: Stretches   Sports administrator  Left;30 seconds;5 reps prone    Gastroc Stretch  Right;Left;5 reps;30 seconds standing    Other Knee/Hip Stretches  Standing with L  foot on high-low table, back of truck to increase Lt knee flexion, x 5 reps.       Knee/Hip Exercises: Aerobic   Elliptical  L2.5 x 5 min      Knee/Hip Exercises: Standing   Other Standing Knee Exercises  Walking laps around gym to reduce stiffness after stretching x1      Moist Heat Therapy   Number Minutes Moist Heat  15 Minutes    Moist Heat Location  Knee posterior      Cryotherapy   Number Minutes Cryotherapy  15 Minutes    Cryotherapy Location  Knee    Type of Cryotherapy  Ice pack anterior      Electrical Stimulation   Electrical Stimulation Location  Lt knee    Electrical Stimulation Action  IFC    Electrical Stimulation Parameters  to tolerance    Electrical Stimulation Goals  Pain      Ultrasound   Ultrasound Location  lateral distal thigh through the lateral patellar area  Ultrasound Parameters  1.5 W/cm2, 1 MHz, 100%, 8 min.    Ultrasound Goals  Pain;Other (Comment) tightness      Manual Therapy   Manual therapy comments  pt supine LE's supported on bolster     Soft tissue mobilization  deep tissue work and instrument assisted soft tissue work through the lateral quad/ITB distally and lateral patella    Chief Strategy Officer  X with 25% stretch over Lt lateral tib platue        PT Short Term Goals - 01/22/18 0921      PT SHORT TERM GOAL #1   Title  Increase AROM to (-)4 to 100 deg 01/28/18    Status  Achieved      PT SHORT TERM GOAL #2   Title  Patient to ambulate with good gait pattern with SPC for functional distances of at least 10-15 min 01/28/18    Status  Achieved      PT SHORT TERM GOAL #3   Title  Independent with initial HEP 01/28/18    Status  Achieved        PT Long Term Goals - 01/28/18 1107      PT LONG TERM GOAL #1   Title  Increase AROM Lt knee to (-) 2 deg extension to 110 deg flexion 03/11/18    Time  12    Period  Weeks    Status  Partially Met      PT LONG TERM GOAL #2   Title  5/5/ strength  Lt LE 03/11/18    Time  12    Period  Weeks    Status  Partially Met      PT LONG TERM GOAL #3   Title  Ambulate without assistive device with good gait pattern for functional distances and at least 20-30 min  03/11/18    Time  12    Period  Weeks    Status  Achieved      PT LONG TERM GOAL #4   Title  Improve FOTO to </= 45% limitation 03/11/18    Time  12    Period  Weeks    Status  On-going      PT LONG TERM GOAL #5   Title  Independent in HEP 03/11/18    Time  12    Period  Weeks    Status  On-going            Plan - 01/28/18 1204    Clinical Impression Statement  Terry Greene's L knee tightness persists, but his ROM is increasing. [place], manual work, and quad stretch in prone were tolerated well and led to improved ROM. He is making progress toward his remaining goals.    Rehab Potential  Good    PT Frequency  2x / week    PT Duration  12 weeks    PT Treatment/Interventions  Patient/family education;ADLs/Self Care Home Management;Cryotherapy;Electrical Stimulation;Iontophoresis 81m/ml Dexamethasone;Moist Heat;Ultrasound;Dry needling;Manual techniques;Neuromuscular re-education;Gait training;Functional mobility training;Therapeutic activities;Therapeutic exercise    PT Next Visit Plan  Knee ROM, flexibility, and strengthening. Review patellar mobilization and introduce scar massage.    Consulted and Agree with Plan of Care  Patient       Patient will benefit from skilled therapeutic intervention in order to improve the following deficits and impairments:  Postural dysfunction, Improper body mechanics, Pain, Increased fascial restricitons, Increased muscle spasms, Decreased mobility, Decreased range of motion, Decreased strength, Decreased activity  tolerance, Abnormal gait  Visit Diagnosis: Stiffness of left knee  Acute pain of left knee  Muscle weakness (generalized)  Other abnormalities of gait and mobility  Stiffness of left knee, not elsewhere classified     Problem  List Patient Active Problem List   Diagnosis Date Noted  . Left lumbar radiculitis 10/23/2017  . Left knee osteoarthritis, post right knee arthroplasty 11/02/2015    Terry Greene, Terry Greene 01/28/18  This entire session was performed under direct supervision and direction of a licensed physical therapist assistant. I have personally read, edited and approve of the note as written.  [name], PTA 01/28/18 12:56 PM  [place], [place], [address] Phone: [phone]   Fax:  [phone]  Name: Terry Greene MRN: [id number] Date of Birth: September 06, 1956

## 2018-01-31 ENCOUNTER — Ambulatory Visit: Payer: PRIVATE HEALTH INSURANCE | Admitting: Physical Therapy

## 2018-01-31 DIAGNOSIS — R2689 Other abnormalities of gait and mobility: Secondary | ICD-10-CM | POA: Diagnosis not present

## 2018-01-31 DIAGNOSIS — M6281 Muscle weakness (generalized): Secondary | ICD-10-CM

## 2018-01-31 DIAGNOSIS — M25662 Stiffness of left knee, not elsewhere classified: Secondary | ICD-10-CM | POA: Diagnosis not present

## 2018-01-31 DIAGNOSIS — M25562 Pain in left knee: Secondary | ICD-10-CM

## 2018-01-31 NOTE — Therapy (Signed)
[place], [place], [address] Phone: [phone]   Fax:  [phone]  Physical Therapy Treatment  Patient Details  Name: Terry Greene MRN: [id number] Date of Birth: Feb 25, 1956 Referring Provider: Dr. Minda Meo   Encounter Date: 01/31/2018  PT End of Session - 01/31/18 1105    Visit Number  14    Number of Visits  24    Date for PT Re-Evaluation  03/11/18    PT Start Time  6761    PT Stop Time  1113    PT Time Calculation (min)  58 min    Activity Tolerance  Patient tolerated treatment well    Behavior During Therapy  [facility] for tasks assessed/performed       No past medical history on file.  No past surgical history on file.  There were no vitals filed for this visit.  Subjective Assessment - 01/31/18 1035    Subjective  Terry Greene has been going to the gym and working on knee strength; he gets stiff afterward. Sometimes he scoots back in the stationary bike to work on knee extension. In general, he is interested in maintaining basic fitness. He hasn't been able to shake the feeling of a "catch" when extending his knee.    Pertinent History  Rt TKA 01/19/17; throat cancer 10 yrs ago; HTN    Patient Stated Goals  get knee moving and strong; walk without walker     Currently in Pain?  No/denies    Pain Onset  More than a month ago         [facility] PT Assessment - 01/31/18 0001      Assessment   Medical Diagnosis  L TKA    Referring Provider  Dr. Minda Meo    Onset Date/Surgical Date  12/07/17    Hand Dominance  Right    Next MD Visit  PRN      PROM   Right/Left Knee  Left    Left Knee Extension  -3      Strength   Left Hip Extension  5/5    Left Hip ABduction  -- 5-/5    Left Hip ADduction  -- 5-/5    Left Knee Flexion  -- 5-/5    Left Knee Extension  5/5        [facility] Adult PT Treatment/Exercise - 01/31/18 0001      Knee/Hip Exercises: Stretches   Passive Hamstring Stretch   Right;Left;2 reps;30 seconds    ITB Stretch  Right;Left;2 reps;30 seconds supine with strap    Gastroc Stretch  Right;Left;2 reps;30 seconds    Other Knee/Hip Stretches  supine adductor stretch with strap x 2 reps each leg.       Knee/Hip Exercises: Aerobic   Stationary Bike  L1-4:  6 min  SPTA present to discuss progress       Knee/Hip Exercises: Standing   Other Standing Knee Exercises  Walking laps around gym to reduce stiffness after stretching x1      Moist Heat Therapy   Number Minutes Moist Heat  15 Minutes    Moist Heat Location  Knee posterior      Cryotherapy   Number Minutes Cryotherapy  15 Minutes    Cryotherapy Location  Knee    Type of Cryotherapy  Ice pack anterior      Electrical Stimulation   Electrical Stimulation Location  L knee    Electrical Stimulation Action  IFC    Electrical  Stimulation Parameters  to tolerance    Electrical Stimulation Goals  Pain      Ultrasound   Ultrasound Location  Lt distal hamstring    Ultrasound Parameters  1.3 w/cm2, 1.0 MHz, x 8 min, 100%     Ultrasound Goals  Pain;Other (Comment) tightness      Manual Therapy   Soft tissue mobilization  STM, muscle stripping, and Edge tool assistance to Lt hamstring to decrease fascial tightness and improve ROM. Pin and stretch to Lt hamstring.       [employer]   Create Space  -- tape held due to open skin (from pt removing tape) Bandaid applied to area of open skin prior to initiating exercise.              PT Short Term Goals - 01/31/18 1134      PT SHORT TERM GOAL #1   Title  Increase AROM to (-)4 to 100 deg 01/28/18    Time  6    Period  Weeks    Status  Achieved      PT SHORT TERM GOAL #2   Title  Patient to ambulate with good gait pattern with SPC for functional distances of at least 10-15 min 01/28/18    Time  6    Period  Weeks    Status  Achieved      PT SHORT TERM GOAL #3   Title  Independent with initial HEP 01/28/18    Time  6    Period  Weeks    Status   Achieved        PT Long Term Goals - 01/31/18 1135      PT LONG TERM GOAL #1   Title  Increase AROM Lt knee to (-) 2 deg extension to 110 deg flexion 03/11/18    Time  12    Period  Weeks    Status  Partially Met      PT LONG TERM GOAL #2   Title  5/5/ strength Lt LE 03/11/18    Time  12    Period  Weeks    Status  Partially Met      PT LONG TERM GOAL #3   Title  Ambulate without assistive device with good gait pattern for functional distances and at least 20-30 min  03/11/18    Time  12    Period  Weeks    Status  Achieved      PT LONG TERM GOAL #4   Title  Improve FOTO to </= 45% limitation 03/11/18    Time  12    Period  Weeks    Status  On-going      PT LONG TERM GOAL #5   Title  Independent in HEP 03/11/18    Time  12    Period  Weeks    Status  On-going            Plan - 01/31/18 1129    Clinical Impression Statement  Terry Greene demonstrated improved LLE strength.  His Lt knee ROM continues to improve; he is progressing toward his remaining goals. Manual work, [place], and stretching of hamstrings were tolerated well, with pt reporting less stiffness with gait at end of session.   Rehab Potential  Good    PT Frequency  2x / week    PT Duration  12 weeks    PT Treatment/Interventions  Patient/family education;ADLs/Self Care Home Management;Cryotherapy;Electrical Stimulation;Iontophoresis 40m/ml Dexamethasone;Moist Heat;Ultrasound;Dry needling;Manual techniques;Neuromuscular re-education;Gait training;Functional mobility training;Therapeutic  activities;Therapeutic exercise    PT Next Visit Plan  Knee ROM, flexibility, and strengthening. Treadmill with decline for eccentric control.    Consulted and Agree with Plan of Care  Patient       Patient will benefit from skilled therapeutic intervention in order to improve the following deficits and impairments:  Postural dysfunction, Improper body mechanics, Pain, Increased fascial restricitons, Increased muscle spasms, Decreased  mobility, Decreased range of motion, Decreased strength, Decreased activity tolerance, Abnormal gait  Visit Diagnosis: Stiffness of left knee  Acute pain of left knee  Muscle weakness (generalized)  Other abnormalities of gait and mobility  Stiffness of left knee, not elsewhere classified     Problem List Patient Active Problem List   Diagnosis Date Noted  . Left lumbar radiculitis 10/23/2017  . Left knee osteoarthritis, post right knee arthroplasty 11/02/2015    Scarlett Presto, SPTA 01/31/18  This entire session was performed under direct supervision and direction of a licensed physical therapist assistant. I have personally read, edited and approve of the note as written. [name], PTA  01/31/18 2:14 PM  [place], [place], [address] Phone: [phone]   Fax:  [phone]  Name: Terry Greene MRN: [id number] Date of Birth: [birth year]

## 2018-02-04 ENCOUNTER — Encounter: Payer: Self-pay | Admitting: Physical Therapy

## 2018-02-04 ENCOUNTER — Ambulatory Visit: Payer: PRIVATE HEALTH INSURANCE | Admitting: Physical Therapy

## 2018-02-04 DIAGNOSIS — R2689 Other abnormalities of gait and mobility: Secondary | ICD-10-CM

## 2018-02-04 DIAGNOSIS — M25562 Pain in left knee: Secondary | ICD-10-CM | POA: Diagnosis not present

## 2018-02-04 DIAGNOSIS — M25662 Stiffness of left knee, not elsewhere classified: Secondary | ICD-10-CM

## 2018-02-04 DIAGNOSIS — M6281 Muscle weakness (generalized): Secondary | ICD-10-CM | POA: Diagnosis not present

## 2018-02-04 NOTE — Therapy (Signed)
[place], [place], [address] Phone: [phone]   Fax:  [phone]  Physical Therapy Treatment  Patient Details  Name: Terry Greene MRN: [id number] Date of Birth: [birth year] Referring Provider: Dr. Minda Meo   Encounter Date: 02/04/2018  PT End of Session - 02/04/18 0812    Visit Number  15    Number of Visits  24    Date for PT Re-Evaluation  03/11/18    PT Start Time  0812 Pt arrived late    PT Stop Time  0900    PT Time Calculation (min)  48 min    Activity Tolerance  Patient tolerated treatment well    Behavior During Therapy  [facility] for tasks assessed/performed       History reviewed. No pertinent past medical history.  History reviewed. No pertinent surgical history.  There were no vitals filed for this visit.  Subjective Assessment - 02/04/18 0816    Subjective  Ronalee Belts still feels that the muscles/tendons in his L knee are stretching when he walks in the morning. He experienced sharp pain in the area after golf Sunday night and "catching" during leg lifts on Saturday. He has been doing patellar mobs and found that pushing laterally took pain away. He feels he walks okay when going downhill, but has pain when doing so, discouraging him from walking with good form.    Pertinent History  Rt TKA 01/19/17; throat cancer 10 yrs ago; HTN    How long can you sit comfortably?  2 min     How long can you stand comfortably?  2 min     How long can you walk comfortably?  5 min     Diagnostic tests  xrays     Patient Stated Goals  get knee moving and strong; walk without walker     Pain Score  2     Pain Location  Knee    Pain Orientation  Left;Lateral    Pain Descriptors / Indicators  Sharp    Pain Onset  More than a month ago    Aggravating Factors   bending knee    Pain Relieving Factors  ice, meds, tape         [facility] PT Assessment - 02/04/18 0001      Flexibility   Quadriceps  L knee  flexion 108       [facility] Adult PT Treatment/Exercise - 02/04/18 0001      Knee/Hip Exercises: Stretches   Passive Hamstring Stretch  Right;Left;2 reps;30 seconds supine    Quad Stretch  Left;2 reps;30 seconds prone    Gastroc Stretch  Both;2 reps;30 seconds      Knee/Hip Exercises: Aerobic   Stationary Bike  L1-4: 3 min  SPTA present to discuss progress    [employer]  2 mph with 14% decline for eccentric control of quad; VCs to avoid circumduction/dragging, allow knee to bend during swing phase, & heel strike even force bilaterally      Knee/Hip Exercises: Standing   Forward Lunges  Both;1 set;10 reps Pt reported "catching" with L knee behind him    Step Down  Left;1 set;10 reps;Hand Hold: 1 Step Height: 7"      Electrical Stimulation   Electrical Stimulation Location  L knee    Electrical Stimulation Action  IFC    Electrical Stimulation Parameters  to tolerance    Electrical Stimulation Goals  Pain      Vasopneumatic  Number Minutes Vasopneumatic   15 minutes    Vasopnuematic Location   Knee L    Vasopneumatic Pressure  Low    Vasopneumatic Temperature   34 F      [employer]   Create Space  -- tape held due to open skin (from pt removing tape)          PT Short Term Goals - 02/04/18 1018      PT SHORT TERM GOAL #1   Title  Increase AROM to (-)4 to 100 deg 01/28/18    Time  6    Period  Weeks    Status  Achieved      PT SHORT TERM GOAL #2   Title  Patient to ambulate with good gait pattern with SPC for functional distances of at least 10-15 min 01/28/18    Time  6    Period  Weeks    Status  Achieved      PT SHORT TERM GOAL #3   Title  Independent with initial HEP 01/28/18    Time  6    Period  Weeks    Status  Achieved        PT Long Term Goals - 02/04/18 1018      PT LONG TERM GOAL #1   Title  Increase AROM Lt knee to (-) 2 deg extension to 110 deg flexion 03/11/18    Time  12    Period  Weeks    Status  Partially Met      PT LONG TERM GOAL #2    Title  5/5/ strength Lt LE 03/11/18    Time  12    Period  Weeks    Status  Partially Met      PT LONG TERM GOAL #3   Title  Ambulate without assistive device with good gait pattern for functional distances and at least 20-30 min  03/11/18    Time  12    Period  Weeks    Status  Achieved      PT LONG TERM GOAL #4   Title  Improve FOTO to </= 45% limitation 03/11/18    Time  12    Period  Weeks    Status  On-going      PT LONG TERM GOAL #5   Title  Independent in HEP 03/11/18    Time  12    Period  Weeks    Status  On-going            Plan - 02/04/18 1020    Clinical Impression Statement  Repeated "catching" in Mike's L knee causes pain and limited knee flexion, resolved with lateral/medial patellar mobs. He is lacking in eccentric control of his quads, but tolerated eccentric strengthening well. Quad flexibility remains unchanged, but he is progressing toward his goals.    Rehab Potential  Good    PT Frequency  2x / week    PT Duration  12 weeks    PT Treatment/Interventions  Patient/family education;ADLs/Self Care Home Management;Cryotherapy;Electrical Stimulation;Iontophoresis 40m/ml Dexamethasone;Moist Heat;Ultrasound;Dry needling;Manual techniques;Neuromuscular re-education;Gait training;Functional mobility training;Therapeutic activities;Therapeutic exercise    PT Next Visit Plan  Knee ROM, flexibility, and strengthening. Treadmill with decline for eccentric control. Taping if L knee abrasion has healed.    Consulted and Agree with Plan of Care  Patient       Patient will benefit from skilled therapeutic intervention in order to improve the following deficits and impairments:  Postural dysfunction, Improper body mechanics, Pain,  Increased fascial restricitons, Increased muscle spasms, Decreased mobility, Decreased range of motion, Decreased strength, Decreased activity tolerance, Abnormal gait  Visit Diagnosis: Stiffness of left knee  Acute pain of left knee  Muscle  weakness (generalized)  Other abnormalities of gait and mobility  Stiffness of left knee, not elsewhere classified     Problem List Patient Active Problem List   Diagnosis Date Noted  . Left lumbar radiculitis 10/23/2017  . Left knee osteoarthritis, post right knee arthroplasty 11/02/2015    Scarlett Presto, SPTA  02/04/2018, 12:50 PM  [name], PTA 02/04/18 12:50 PM  [place], [place], [address] Phone: [phone]   Fax:  [phone]  Name: Phoenyx Paulsen MRN: [id number] Date of Birth: 1956-06-09

## 2018-02-06 ENCOUNTER — Encounter: Payer: PRIVATE HEALTH INSURANCE | Admitting: Physical Therapy

## 2018-02-07 ENCOUNTER — Ambulatory Visit: Payer: PRIVATE HEALTH INSURANCE | Admitting: Physical Therapy

## 2018-02-07 ENCOUNTER — Encounter: Payer: Self-pay | Admitting: Physical Therapy

## 2018-02-07 DIAGNOSIS — M6281 Muscle weakness (generalized): Secondary | ICD-10-CM

## 2018-02-07 DIAGNOSIS — R2689 Other abnormalities of gait and mobility: Secondary | ICD-10-CM | POA: Diagnosis not present

## 2018-02-07 DIAGNOSIS — M25662 Stiffness of left knee, not elsewhere classified: Secondary | ICD-10-CM

## 2018-02-07 DIAGNOSIS — M25562 Pain in left knee: Secondary | ICD-10-CM

## 2018-02-07 NOTE — Therapy (Signed)
[place], [place], [address] Phone: [phone]   Fax:  [phone]  Physical Therapy Treatment  Patient Details  Name: Terry Greene MRN: [id number] Date of Birth: 1956-01-31 Referring Provider: Dr. Minda Meo   Encounter Date: 02/07/2018  PT End of Session - 02/07/18 1745    Visit Number  16    Number of Visits  24    Date for PT Re-Evaluation  03/11/18    PT Start Time  0102    PT Stop Time  7253    PT Time Calculation (min)  59 min    Activity Tolerance  Patient tolerated treatment well    Behavior During Therapy  [facility] for tasks assessed/performed       History reviewed. No pertinent past medical history.  History reviewed. No pertinent surgical history.  There were no vitals filed for this visit.  Subjective Assessment - 02/07/18 1623    Subjective  Terry Greene returned to work on Monday. The stresses on his knees were tolerable at first, but he described Tuesday as "horrible." Both of his knees have swollen slightly.    Pertinent History  Rt TKA 01/19/17; throat cancer 10 yrs ago; HTN    Diagnostic tests  xrays     Patient Stated Goals  get knee moving and strong; walk without walker     Pain Score  3     Pain Location  Knee    Pain Orientation  Left;Lateral    Pain Descriptors / Indicators  Sharp    Pain Relieving Factors  Using roller, patellar mobs         [facility] PT Assessment - 02/07/18 0001      Assessment   Medical Diagnosis  L TKA    Referring Provider  Dr. Minda Meo    Onset Date/Surgical Date  12/07/17    Hand Dominance  Right    Next MD Visit  PRN      Observation/Other Assessments   Focus on Therapeutic Outcomes (FOTO)   36% limitation       [facility] Adult PT Treatment/Exercise - 02/07/18 0001      Knee/Hip Exercises: Stretches   Passive Hamstring Stretch  Both;30 seconds Supine with strap; 2 reps L, 1 rep R    Quad Stretch  Left;2 reps;30 seconds prone    Gastroc Stretch  Both;2 reps;30 seconds    Other Knee/Hip Stretches  Seated scoots 15s x5      Knee/Hip Exercises: Aerobic   Stationary Bike  L1-5: 5 min  SPTA present to discuss progress    [employer]  2 mph with 15% decline, 2 min: for eccentric control of quad; discontinued due to pt exhibiting no difficulty      Knee/Hip Exercises: Standing   Step Down  Left 5 reps 10"; 5 reps 8"; 10 reps, 2 sets 6"      Electrical Stimulation   Electrical Stimulation Location  L knee    Electrical Stimulation Action  High volt    Electrical Stimulation Parameters  to tolerance    Electrical Stimulation Goals  Edema      Vasopneumatic   Number Minutes Vasopneumatic   15 minutes    Vasopnuematic Location   Knee L    Vasopneumatic Pressure  Low    Vasopneumatic Temperature   34 F      Manual Therapy   Soft tissue mobilization  STM, muscle stripping, and Edge tool assistance to L quad and L knee  joint line to decrease fascial tightness and improve ROM.        PT Short Term Goals - 02/07/18 1745      PT SHORT TERM GOAL #1   Title  Increase AROM to (-)4 to 100 deg 01/28/18    Time  6    Period  Weeks    Status  Achieved      PT SHORT TERM GOAL #2   Title  Patient to ambulate with good gait pattern with SPC for functional distances of at least 10-15 min 01/28/18    Time  6    Period  Weeks    Status  Achieved      PT SHORT TERM GOAL #3   Title  Independent with initial HEP 01/28/18    Time  6    Period  Weeks    Status  Achieved        PT Long Term Goals - 02/07/18 1746      PT LONG TERM GOAL #1   Title  Increase AROM Lt knee to (-) 2 deg extension to 110 deg flexion 03/11/18    Time  12    Period  Weeks    Status  Partially Met      PT LONG TERM GOAL #2   Title  5/5/ strength Lt LE 03/11/18    Time  12    Period  Weeks    Status  Partially Met      PT LONG TERM GOAL #3   Title  Ambulate without assistive device with good gait pattern for functional distances and at least  20-30 min  03/11/18    Time  12    Period  Weeks    Status  Achieved      PT LONG TERM GOAL #4   Title  Improve FOTO to </= 45% limitation 03/11/18    Time  12    Period  Weeks    Status  Achieved      PT LONG TERM GOAL #5   Title  Independent in HEP 03/11/18    Time  12    Period  Weeks    Status  On-going            Plan - 02/07/18 1750    Clinical Impression Statement  Terry Greene's eccentric control of his L quad has increased to the point the decline treadmill no longer provides a challenge. His primary challenge now is ascending/descending steep stairs with his LLE. He tolerated well functional exercises on 6" stairs. He has met LTG#4 and is progressing toward his remaining goals.    Rehab Potential  Good    PT Frequency  2x / week    PT Duration  12 weeks    PT Treatment/Interventions  Patient/family education;ADLs/Self Care Home Management;Cryotherapy;Electrical Stimulation;Iontophoresis 92m/ml Dexamethasone;Moist Heat;Ultrasound;Dry needling;Manual techniques;Neuromuscular re-education;Gait training;Functional mobility training;Therapeutic activities;Therapeutic exercise    PT Next Visit Plan  Knee ROM, flexibility, and strengthening; step up/down exercises. Taping if L knee abrasion has healed.    Consulted and Agree with Plan of Care  Patient       Patient will benefit from skilled therapeutic intervention in order to improve the following deficits and impairments:  Postural dysfunction, Improper body mechanics, Pain, Increased fascial restricitons, Increased muscle spasms, Decreased mobility, Decreased range of motion, Decreased strength, Decreased activity tolerance, Abnormal gait  Visit Diagnosis: Stiffness of left knee  Acute pain of left knee  Muscle weakness (generalized)  Other abnormalities of gait and  mobility  Stiffness of left knee, not elsewhere classified     Problem List Patient Active Problem List   Diagnosis Date Noted  . Left lumbar radiculitis  10/23/2017  . Left knee osteoarthritis, post right knee arthroplasty 11/02/2015    Terry Greene, SPTA 02/07/2018, 5:58 PM   [name], PTA 02/07/18 6:07 PM  [place], [place], [address] Phone: [phone]   Fax:  [phone]  Name: Red Mandt MRN: [id number] Date of Birth: October 07, 1956

## 2018-02-14 ENCOUNTER — Ambulatory Visit: Payer: PRIVATE HEALTH INSURANCE | Admitting: Rehabilitative and Restorative Service Providers"

## 2018-02-14 ENCOUNTER — Encounter: Payer: Self-pay | Admitting: Rehabilitative and Restorative Service Providers"

## 2018-02-14 DIAGNOSIS — M25562 Pain in left knee: Secondary | ICD-10-CM

## 2018-02-14 DIAGNOSIS — M25662 Stiffness of left knee, not elsewhere classified: Secondary | ICD-10-CM

## 2018-02-14 DIAGNOSIS — M6281 Muscle weakness (generalized): Secondary | ICD-10-CM

## 2018-02-14 NOTE — Therapy (Signed)
[place], [place], [address] Phone: [phone]   Fax:  [phone]  Physical Therapy Treatment  Patient Details  Name: Terry Greene MRN: [id number] Date of Birth: 06-10-1956 Referring Provider: Dr. Minda Meo    Encounter Date: 02/14/2018  PT End of Session - 02/14/18 1534    Visit Number  17    Number of Visits  24    Date for PT Re-Evaluation  03/11/18    PT Start Time  6384    PT Stop Time  1635    PT Time Calculation (min)  65 min    Activity Tolerance  Patient tolerated treatment well       History reviewed. No pertinent past medical history.  History reviewed. No pertinent surgical history.  There were no vitals filed for this visit.  Subjective Assessment - 02/14/18 1534    Subjective  Pt played 9 holes of golf yesterday.  Today both his knees are hurting; unsure if it was the twisting during golf.  His Lt knee has started clicking again. No paiin or clicking post treatment.     Currently in Pain?  Yes    Pain Score  5     Pain Location  Knee    Pain Orientation  Right;Left    Pain Descriptors / Indicators  Tightness;Burning    Aggravating Factors   bending knee     Pain Relieving Factors  patellar mobs, ice         [facility] PT Assessment - 02/14/18 0001      Assessment   Medical Diagnosis  L TKA    Referring Provider  Dr. Minda Meo     Onset Date/Surgical Date  12/07/17    Hand Dominance  Right    Next MD Visit  PRN                   [facility] Adult PT Treatment/Exercise - 02/14/18 0001      Ambulation/Gait   Gait Comments  ambulatioin between treatments to release tightness and assess response to treatment       Knee/Hip Exercises: Stretches   ITB Stretch  Right;Left;2 reps;30 seconds;4 reps 2 reps on Lt 4 on Lt with PT assist for stretching on Lt     Gastroc Stretch  Both;2 reps;30 seconds    Other Knee/Hip Stretches  hip adductor stretch Rt 30 sec x  2; frog stretch 30 sec x 3 reps       Knee/Hip Exercises: Aerobic   Stationary Bike  L1-3: 5.5 min  PTA present to discuss progress      Knee/Hip Exercises: Standing   Knee Flexion  AROM;AAROM;Right;Left;5 reps deep knee bend for knee flexion       Electrical Stimulation   Electrical Stimulation Location  L knee    Electrical Stimulation Action  IFC    Electrical Stimulation Parameters  to tolerance    Electrical Stimulation Goals  Edema      Ultrasound   Ultrasound Location  lateral Lt knee    Ultrasound Parameters  1.0 w/cm2; 1 mHz; 8 min; 100%    Ultrasound Goals  Other (Comment);Pain tightness       Vasopneumatic   Number Minutes Vasopneumatic   15 minutes    Vasopnuematic Location   Knee L    Vasopneumatic Pressure  Low    Vasopneumatic Temperature   34 F      Manual Therapy   Soft tissue mobilization  deep tissue Edge tool assistance to L lateral quad; IT band    Passive ROM  patellar mobilitzation laterally to medially and distally               PT Short Term Goals - 02/07/18 1745      PT SHORT TERM GOAL #1   Title  Increase AROM to (-)4 to 100 deg 01/28/18    Time  6    Period  Weeks    Status  Achieved      PT SHORT TERM GOAL #2   Title  Patient to ambulate with good gait pattern with SPC for functional distances of at least 10-15 min 01/28/18    Time  6    Period  Weeks    Status  Achieved      PT SHORT TERM GOAL #3   Title  Independent with initial HEP 01/28/18    Time  6    Period  Weeks    Status  Achieved        PT Long Term Goals - 02/07/18 1746      PT LONG TERM GOAL #1   Title  Increase AROM Lt knee to (-) 2 deg extension to 110 deg flexion 03/11/18    Time  12    Period  Weeks    Status  Partially Met      PT LONG TERM GOAL #2   Title  5/5/ strength Lt LE 03/11/18    Time  12    Period  Weeks    Status  Partially Met      PT LONG TERM GOAL #3   Title  Ambulate without assistive device with good gait pattern for functional  distances and at least 20-30 min  03/11/18    Time  12    Period  Weeks    Status  Achieved      PT LONG TERM GOAL #4   Title  Improve FOTO to </= 45% limitation 03/11/18    Time  12    Period  Weeks    Status  Achieved      PT LONG TERM GOAL #5   Title  Independent in HEP 03/11/18    Time  12    Period  Weeks    Status  On-going            Plan - 02/14/18 1614    Clinical Impression Statement  muscular tightness persists lateral thigh including TFL/ITB/lateral quad on Lt LE and medial thigh/medial quad Rt LE. Good response to modalities, manual work and stretching. Pain free gait following treatment. Will add stretching for home. Continue with strengthening and ROM exercises. Progressing well toward stated goals of therapy.     Rehab Potential  Good    PT Frequency  2x / week    PT Treatment/Interventions  Patient/family education;ADLs/Self Care Home Management;Cryotherapy;Electrical Stimulation;Iontophoresis 48m/ml Dexamethasone;Moist Heat;Ultrasound;Dry needling;Manual techniques;Neuromuscular re-education;Gait training;Functional mobility training;Therapeutic activities;Therapeutic exercise    PT Next Visit Plan  Knee ROM, flexibility, and strengthening; step up/down exercises. Taping if L knee abrasion has healed.    Consulted and Agree with Plan of Care  Patient       Patient will benefit from skilled therapeutic intervention in order to improve the following deficits and impairments:  Postural dysfunction, Improper body mechanics, Pain, Increased fascial restricitons, Increased muscle spasms, Decreased mobility, Decreased range of motion, Decreased strength, Decreased activity tolerance, Abnormal gait  Visit Diagnosis: Stiffness of left knee  Acute pain  of left knee  Muscle weakness (generalized)     Problem List Patient Active Problem List   Diagnosis Date Noted  . Left lumbar radiculitis 10/23/2017  . Left knee osteoarthritis, post right knee arthroplasty  11/02/2015    Zelma Snead Nilda Simmer PT, MPH  02/14/2018, 4:31 PM  [place], [place], [address] Phone: [phone]   Fax:  [phone]  Name: Terry Greene MRN: [id number] Date of Birth: 18-May-1956

## 2018-02-18 ENCOUNTER — Encounter: Payer: Self-pay | Admitting: Physical Therapy

## 2018-02-18 ENCOUNTER — Ambulatory Visit: Payer: PRIVATE HEALTH INSURANCE | Admitting: Physical Therapy

## 2018-02-18 DIAGNOSIS — M25662 Stiffness of left knee, not elsewhere classified: Secondary | ICD-10-CM

## 2018-02-18 DIAGNOSIS — M6281 Muscle weakness (generalized): Secondary | ICD-10-CM | POA: Diagnosis not present

## 2018-02-18 DIAGNOSIS — M25562 Pain in left knee: Secondary | ICD-10-CM | POA: Diagnosis not present

## 2018-02-18 NOTE — Therapy (Signed)
[place], [place], [address] Phone: [phone]   Fax:  [phone]  Physical Therapy Treatment  Patient Details  Name: Terry Greene MRN: [id number] Date of Birth: 1956-08-09 Referring Provider: Dr. Minda Meo    Encounter Date: 02/18/2018  PT End of Session - 02/18/18 1521    Visit Number  18    Number of Visits  24    Date for PT Re-Evaluation  03/11/18    PT Start Time  2376    PT Stop Time  1621    PT Time Calculation (min)  66 min    Activity Tolerance  Patient tolerated treatment well    Behavior During Therapy  [facility] for tasks assessed/performed       History reviewed. No pertinent past medical history.  History reviewed. No pertinent surgical history.  There were no vitals filed for this visit.  Subjective Assessment - 02/18/18 1522    Subjective  Pt reports he thinks he overdid it at gym over weekend, with hamstring curls and knee extension.  Both his knees are sore today.     Patient Stated Goals  get knee moving and strong; walk without walker     Currently in Pain?  Yes    Pain Score  4     Pain Location  Knee    Pain Orientation  Right;Left    Pain Descriptors / Indicators  Tightness;Sore    Aggravating Factors   over doing it,  over bending knee    Pain Relieving Factors  ice, patellar mobs.          [facility] PT Assessment - 02/18/18 0001      Assessment   Medical Diagnosis  L TKA    Referring Provider  Dr. Minda Meo     Onset Date/Surgical Date  12/07/17    Hand Dominance  Right    Next MD Visit  PRN       [facility] Adult PT Treatment/Exercise - 02/18/18 0001      Ambulation/Gait   Gait Comments  ambulatioin between treatments to release tightness and assess response to treatment       Knee/Hip Exercises: Stretches   Passive Hamstring Stretch  Right;Left;2 reps;30 seconds    Quad Stretch  Left;2 reps;30 seconds standing, foot on chair seat    Hip Flexor  Stretch  Right;Left;1 rep;30 seconds seated in chair, leg back.     ITB Stretch  Right;Left;2 reps;30 seconds standing, with UE support.     Gastroc Stretch  Both;2 reps;30 seconds    Other Knee/Hip Stretches  standing hip adductor stretch Rt/Lt 30 sec x 2      Knee/Hip Exercises: Aerobic   Stationary Bike  L5: 5 min  PTA present to discuss progress      Ultrasound   Ultrasound Location  Lt lateral knee     Ultrasound Parameters  1.0 w/cm2, 1.0MHz, 100%, 8 min    Ultrasound Goals  Other (Comment);Pain tightness       Vasopneumatic   Number Minutes Vasopneumatic   15 minutes    Vasopnuematic Location   Knee L    Vasopneumatic Pressure  Low    Vasopneumatic Temperature   34 F      Manual Therapy   Soft tissue mobilization  deep tissue Edge tool assistance to L lateral quad; IT band    Passive ROM  patellar mobilitzation laterally to medially and distally  PT Short Term Goals - 02/07/18 1745      PT SHORT TERM GOAL #1   Title  Increase AROM to (-)4 to 100 deg 01/28/18    Time  6    Period  Weeks    Status  Achieved      PT SHORT TERM GOAL #2   Title  Patient to ambulate with good gait pattern with SPC for functional distances of at least 10-15 min 01/28/18    Time  6    Period  Weeks    Status  Achieved      PT SHORT TERM GOAL #3   Title  Independent with initial HEP 01/28/18    Time  6    Period  Weeks    Status  Achieved        PT Long Term Goals - 02/07/18 1746      PT LONG TERM GOAL #1   Title  Increase AROM Lt knee to (-) 2 deg extension to 110 deg flexion 03/11/18    Time  12    Period  Weeks    Status  Partially Met      PT LONG TERM GOAL #2   Title  5/5/ strength Lt LE 03/11/18    Time  12    Period  Weeks    Status  Partially Met      PT LONG TERM GOAL #3   Title  Ambulate without assistive device with good gait pattern for functional distances and at least 20-30 min  03/11/18    Time  12    Period  Weeks    Status  Achieved       PT LONG TERM GOAL #4   Title  Improve FOTO to </= 45% limitation 03/11/18    Time  12    Period  Weeks    Status  Achieved      PT LONG TERM GOAL #5   Title  Independent in HEP 03/11/18    Time  12    Period  Weeks    Status  On-going            Plan - 02/18/18 1542    Clinical Impression Statement  Pt reporting flare up of symptoms after increase in resistance training for LE.  Pt shown alternate versions of stretches to assist with reduction of tightness in BLE; tolerated all well.  Pt reported reduction of pain and tightness with LE stretches and further reduction with manual therapy.  Pt near meeting remaining goals.     Rehab Potential  Good    PT Frequency  2x / week    PT Duration  12 weeks    PT Treatment/Interventions  Patient/family education;ADLs/Self Care Home Management;Cryotherapy;Electrical Stimulation;Iontophoresis 32m/ml Dexamethasone;Moist Heat;Ultrasound;Dry needling;Manual techniques;Neuromuscular re-education;Gait training;Functional mobility training;Therapeutic activities;Therapeutic exercise    PT Next Visit Plan  continue progressive Lt knee ROM, flexibility, strengthening.     Consulted and Agree with Plan of Care  Patient       Patient will benefit from skilled therapeutic intervention in order to improve the following deficits and impairments:  Postural dysfunction, Improper body mechanics, Pain, Increased fascial restricitons, Increased muscle spasms, Decreased mobility, Decreased range of motion, Decreased strength, Decreased activity tolerance, Abnormal gait  Visit Diagnosis: Stiffness of left knee  Acute pain of left knee  Muscle weakness (generalized)     Problem List Patient Active Problem List   Diagnosis Date Noted  . Left lumbar radiculitis 10/23/2017  . Left  knee osteoarthritis, post right knee arthroplasty 11/02/2015   [name], PTA 02/18/18 4:06 PM  [place], [place], [address] Phone: [phone]   Fax:  [phone]  Name: Terry Greene MRN: [id number] Date of Birth: 1956/01/30

## 2018-02-20 ENCOUNTER — Other Ambulatory Visit: Payer: Self-pay | Admitting: Sports Medicine

## 2018-02-21 ENCOUNTER — Ambulatory Visit: Payer: PRIVATE HEALTH INSURANCE | Admitting: Physical Therapy

## 2018-02-21 DIAGNOSIS — M25662 Stiffness of left knee, not elsewhere classified: Secondary | ICD-10-CM | POA: Diagnosis not present

## 2018-02-21 DIAGNOSIS — M6281 Muscle weakness (generalized): Secondary | ICD-10-CM

## 2018-02-21 DIAGNOSIS — M25562 Pain in left knee: Secondary | ICD-10-CM

## 2018-02-21 NOTE — Therapy (Signed)
[place], [place], [address] Phone: [phone]   Fax:  [phone]  Physical Therapy Treatment  Patient Details  Name: Terry Greene MRN: [id number] Date of Birth: [birth year] Referring Provider: Dr. Minda Meo   Encounter Date: 02/21/2018  PT End of Session - 02/21/18 1536    Visit Number  19    Number of Visits  24    Date for PT Re-Evaluation  03/11/18    PT Start Time  9678    PT Stop Time  1626    PT Time Calculation (min)  55 min    Activity Tolerance  Patient tolerated treatment well    Behavior During Therapy  [facility] for tasks assessed/performed       No past medical history on file.  No past surgical history on file.  There were no vitals filed for this visit.  Subjective Assessment - 02/21/18 1536    Subjective  Pt reports that his Rt knee was very painful the day after last visit (residual pain from heavy work out day before).  He continues to complain of pain in bilat lateral knee pain.  He is trying to wean from Tramadol (has been on medicine for 3 yrs)    Patient Stated Goals  get knee moving and strong; walk without walker     Currently in Pain?  Yes    Pain Score  4     Pain Location  Knee    Pain Orientation  Left;Right    Pain Descriptors / Indicators  Aching;Sore;Tightness    Aggravating Factors   overdoing it     Pain Relieving Factors  ice, rubbing knee.          [facility] PT Assessment - 02/21/18 0001      Assessment   Medical Diagnosis  L TKA    Referring Provider  Dr. Minda Meo    Onset Date/Surgical Date  12/07/17    Hand Dominance  Right    Next MD Visit  PRN      Strength   Left Hip ABduction  5/5    Left Hip ADduction  5/5    Left Knee Flexion  5/5       [facility] Adult PT Treatment/Exercise - 02/21/18 0001      Knee/Hip Exercises: Stretches   Passive Hamstring Stretch  Right;Left;3 reps;30 seconds    Quad Stretch  Left;2 reps;30 seconds standing,  foot on chair seat    ITB Stretch  Right;Left;2 reps;30 seconds standing, with UE support.     Gastroc Stretch  Both;30 seconds;3 reps    Other Knee/Hip Stretches  standing hip adductor stretch Rt/Lt 30 sec x 2      Knee/Hip Exercises: Aerobic   Stationary Bike  L3: 2.5 min  PTA present to discuss progress    Elliptical  L2. x 3.5 min      Knee/Hip Exercises: Standing   Other Standing Knee Exercises  Walking laps around gym to reduce stiffness after exercises.       Knee/Hip Exercises: Supine   Quad Sets  Left;1 set;5 reps    Heel Slides  AAROM;Left;1 set;5 reps      Vasopneumatic   Number Minutes Vasopneumatic   15 minutes    Vasopnuematic Location   Knee L    Vasopneumatic Pressure  Low    Vasopneumatic Temperature   34 F      Manual Therapy   Soft tissue mobilization  STM, muscle stripping,  and Edge tool assistance to Lt hamstring to decrease fascial tightness and improve ROM. Pin and stretch to Lt hamstring.   STM to L lateral distal quad (with and without Edge tool)    Passive ROM  Overpressure into Lt knee extension, mulitple reps.                PT Short Term Goals - 02/07/18 1745      PT SHORT TERM GOAL #1   Title  Increase AROM to (-)4 to 100 deg 01/28/18    Time  6    Period  Weeks    Status  Achieved      PT SHORT TERM GOAL #2   Title  Patient to ambulate with good gait pattern with SPC for functional distances of at least 10-15 min 01/28/18    Time  6    Period  Weeks    Status  Achieved      PT SHORT TERM GOAL #3   Title  Independent with initial HEP 01/28/18    Time  6    Period  Weeks    Status  Achieved        PT Long Term Goals - 02/21/18 1540      PT LONG TERM GOAL #1   Title  Increase AROM Lt knee to (-) 2 deg extension to 110 deg flexion 03/11/18    Time  12    Period  Weeks    Status  Partially Met      PT LONG TERM GOAL #2   Title  5/5/ strength Lt LE 03/11/18    Time  12    Period  Weeks    Status  Achieved      PT LONG TERM  GOAL #3   Title  Ambulate without assistive device with good gait pattern for functional distances and at least 20-30 min  03/11/18    Time  12    Period  Weeks    Status  Achieved      PT LONG TERM GOAL #4   Title  Improve FOTO to </= 45% limitation 03/11/18    Time  12    Period  Weeks    Status  Achieved      PT LONG TERM GOAL #5   Title  Independent in HEP 03/11/18    Time  12    Period  Weeks    Status  Partially Met            Plan - 02/21/18 1557    Clinical Impression Statement  Pt demonstrated improved LLE strength; has met LTG#2.  He continues to lack full knee ext- improved after manual therapy.  Encouraged pt to add more massage and hamstring stretches to HEP to assist in 0 deg knee ext.  Pt tolerated treatment well, reporting reduction in bilat knee pain at end of session.     Rehab Potential  Good    PT Frequency  2x / week    PT Duration  12 weeks    PT Treatment/Interventions  Patient/family education;ADLs/Self Care Home Management;Cryotherapy;Electrical Stimulation;Iontophoresis '4mg'$ /ml Dexamethasone;Moist Heat;Ultrasound;Dry needling;Manual techniques;Neuromuscular re-education;Gait training;Functional mobility training;Therapeutic activities;Therapeutic exercise    PT Next Visit Plan  assess goals; end of POC.     Consulted and Agree with Plan of Care  Patient       Patient will benefit from skilled therapeutic intervention in order to improve the following deficits and impairments:  Postural dysfunction, Improper body mechanics, Pain, Increased  fascial restricitons, Increased muscle spasms, Decreased mobility, Decreased range of motion, Decreased strength, Decreased activity tolerance, Abnormal gait  Visit Diagnosis: Stiffness of left knee  Acute pain of left knee  Muscle weakness (generalized)     Problem List Patient Active Problem List   Diagnosis Date Noted  . Left lumbar radiculitis 10/23/2017  . Left knee osteoarthritis, post right knee  arthroplasty 11/02/2015   [name], PTA 02/21/18 6:34 PM  [place], [place], [address] Phone: [phone]   Fax:  [phone]  Name: Terry Greene MRN: [id number] Date of Birth: 1956/03/16

## 2018-02-25 ENCOUNTER — Other Ambulatory Visit: Payer: Self-pay | Admitting: Sports Medicine

## 2018-02-25 DIAGNOSIS — M170 Bilateral primary osteoarthritis of knee: Secondary | ICD-10-CM

## 2018-02-28 ENCOUNTER — Ambulatory Visit: Payer: PRIVATE HEALTH INSURANCE | Admitting: Physical Therapy

## 2018-02-28 DIAGNOSIS — M25562 Pain in left knee: Secondary | ICD-10-CM

## 2018-02-28 DIAGNOSIS — M6281 Muscle weakness (generalized): Secondary | ICD-10-CM

## 2018-02-28 DIAGNOSIS — M25662 Stiffness of left knee, not elsewhere classified: Secondary | ICD-10-CM

## 2018-02-28 NOTE — Therapy (Addendum)
[facility] [address] [place], [place], [address] Phone: [phone]   Fax:  [phone]  Physical Therapy Treatment  Patient Details  Name: Terry Greene MRN: [id number] Date of Birth: 18-Sep-1956 Referring Provider: Dr. Minda Meo   Encounter Date: 02/28/2018  PT End of Session - 02/28/18 1534    Visit Number  20    Number of Visits  24    Date for PT Re-Evaluation  03/11/18    PT Start Time  1529    PT Stop Time  1622    PT Time Calculation (min)  53 min    Activity Tolerance  Patient tolerated treatment well    Behavior During Therapy  [facility] for tasks assessed/performed       No past medical history on file.  No past surgical history on file.  There were no vitals filed for this visit.  Subjective Assessment - 02/28/18 1535    Subjective  Pt reports he continues to have stiffness with prolonged sitting or standing.  Persistant tightness in Lt lateral knee.  He feels he is still working on strength for both knees.     Patient Stated Goals  get knee moving and strong;     Currently in Pain?  No/denies    Pain Score  0-No pain         [facility] PT Assessment - 02/28/18 0001      Assessment   Medical Diagnosis  L TKA    Referring Provider  Dr. Minda Meo    Onset Date/Surgical Date  12/07/17    Hand Dominance  Right    Next MD Visit  PRN      AROM   Right Knee Flexion  106    Left Knee Extension  -2 after manual therapy     Left Knee Flexion  112      Flexibility   Quadriceps  114 deg Lt knee flexion;  Rt 102 deg         [facility] Adult PT Treatment/Exercise - 02/28/18 0001      Knee/Hip Exercises: Stretches   Passive Hamstring Stretch  Left;2 reps;30 seconds    Quad Stretch  Right;Left;30 seconds;5 reps 2 reps standing, 2 reps prone    Gastroc Stretch  Right;Left;2 reps;30 seconds    Other Knee/Hip Stretches  Lt foot on elevated table and back of truck, multiple reps held 20 sec- forward  lunging, to increase ROM      Knee/Hip Exercises: Aerobic   Stationary Bike  L5: 5 min       Knee/Hip Exercises: Supine   Quad Sets  Left;1 set;5 reps for measurement    Heel Slides  AAROM;Left;1 set;5 reps for measurement    Bridges  Both;1 set;10 reps    Single Leg Bridge  Right;Left;1 set;10 reps      Vasopneumatic   Number Minutes Vasopneumatic   15 minutes    Vasopnuematic Location   Knee L    Vasopneumatic Pressure  Low    Vasopneumatic Temperature   34 F      Manual Therapy   Soft tissue mobilization  STM, muscle stripping, and Edge tool assistance to Lt hamstring, calf, and quad to decrease fascial tightness and improve ROM.     Passive ROM  Overpressure into Lt knee extension, mulitple reps.       [place] tape in X with 25% stretch over Lt lateral tib platue to decrease edema,  decompress tissue and decrease pain.              PT Education - 02/28/18 1749    Education provided  Yes    Education Details  reviewed HEP, added bridging (single leg)    Person(s) Educated  Patient    Methods  Explanation;Handout;Verbal cues;Tactile cues;Demonstration    Comprehension  Verbalized understanding;Returned demonstration       PT Short Term Goals - 02/07/18 1745      PT SHORT TERM GOAL #1   Title  Increase AROM to (-)4 to 100 deg 01/28/18    Time  6    Period  Weeks    Status  Achieved      PT SHORT TERM GOAL #2   Title  Patient to ambulate with good gait pattern with SPC for functional distances of at least 10-15 min 01/28/18    Time  6    Period  Weeks    Status  Achieved      PT SHORT TERM GOAL #3   Title  Independent with initial HEP 01/28/18    Time  6    Period  Weeks    Status  Achieved        PT Long Term Goals - 02/28/18 1626      PT LONG TERM GOAL #1   Title  Increase AROM Lt knee to (-) 2 deg extension to 110 deg flexion 03/11/18    Time  12    Period  Weeks    Status  Achieved      PT LONG TERM GOAL #2   Title   5/5/ strength Lt LE 03/11/18    Time  12    Period  Weeks    Status  Achieved      PT LONG TERM GOAL #3   Title  Ambulate without assistive device with good gait pattern for functional distances and at least 20-30 min  03/11/18    Time  12    Period  Weeks    Status  Achieved      PT LONG TERM GOAL #4   Title  Improve FOTO to </= 45% limitation 03/11/18    Time  12    Period  Weeks    Status  Achieved      PT LONG TERM GOAL #5   Title  Independent in HEP 03/11/18    Time  12    Period  Weeks    Status  Achieved            Plan - 02/28/18 1750    Clinical Impression Statement  Pt demonstrated improved Lt knee ROM.  He continues with fascial tightness in Lt thigh and calf; improved with manual therapy.  Pt tolerated all exercises well and has met all goals.  Pt verbalized readiness to d/c to HEP at this time.     Rehab Potential  Good    PT Frequency  2x / week    PT Duration  12 weeks    PT Treatment/Interventions  Patient/family education;ADLs/Self Care Home Management;Cryotherapy;Electrical Stimulation;Iontophoresis 16m/ml Dexamethasone;Moist Heat;Ultrasound;Dry needling;Manual techniques;Neuromuscular re-education;Gait training;Functional mobility training;Therapeutic activities;Therapeutic exercise    PT Next Visit Plan  spoke to supervising PT; will d/c to HEP.     Consulted and Agree with Plan of Care  Patient       Patient will benefit from skilled therapeutic intervention in order to improve the following deficits and impairments:  Postural dysfunction, Improper body mechanics, Pain, Increased  fascial restricitons, Increased muscle spasms, Decreased mobility, Decreased range of motion, Decreased strength, Decreased activity tolerance, Abnormal gait  Visit Diagnosis: Stiffness of left knee  Acute pain of left knee  Muscle weakness (generalized)     Problem List Patient Active Problem List   Diagnosis Date Noted  . Left lumbar radiculitis 10/23/2017  . Left knee  osteoarthritis, post right knee arthroplasty 11/02/2015   [name], PTA 02/28/18 5:56 PM  [address] [place] [address] [place], [place], [address] Phone: [phone]   Fax:  [phone]  Name: Kazden Largo MRN: [id number] Date of Birth: Dec 07, 1955   PHYSICAL THERAPY DISCHARGE SUMMARY  Visits from Start of Care: 20 Current functional level related to goals / functional outcomes: See above, all goals met   Remaining deficits: Intermittent pain with increased activity   Education / Equipment: HEP Plan: Patient agrees to discharge.  Patient goals were met. Patient is being discharged due to meeting the stated rehab goals.  ?????    Jeral Pinch, PT 03/06/18 7:33 AM

## 2018-02-28 NOTE — Patient Instructions (Signed)
Bridging (Single Leg)    Lie on back with feet shoulder width apart and right leg straight. Lift hips toward the ceiling while keeping leg straight. Hold _2-5___ seconds. Repeat _10___ times. Do _2___ sessions per session, 3x/wk.  Marland Kitchen

## 2018-04-30 ENCOUNTER — Encounter: Payer: Self-pay | Admitting: Sports Medicine

## 2018-04-30 ENCOUNTER — Ambulatory Visit (INDEPENDENT_AMBULATORY_CARE_PROVIDER_SITE_OTHER): Payer: PRIVATE HEALTH INSURANCE

## 2018-04-30 ENCOUNTER — Ambulatory Visit: Payer: PRIVATE HEALTH INSURANCE | Admitting: Sports Medicine

## 2018-04-30 DIAGNOSIS — Z96653 Presence of artificial knee joint, bilateral: Secondary | ICD-10-CM

## 2018-04-30 DIAGNOSIS — M25562 Pain in left knee: Secondary | ICD-10-CM | POA: Diagnosis not present

## 2018-04-30 DIAGNOSIS — Z96652 Presence of left artificial knee joint: Secondary | ICD-10-CM

## 2018-04-30 NOTE — Progress Notes (Signed)
Subjective:    CC: Left knee pain  HPI: Terry Greene returns, he is about 4 months post left knee arthroplasty, things are going well until recently, started to have some pain over the lateral aspect of his knee, worse with going down [place].  No swelling, no fevers or chills, redness.  No trauma.  Concerned and would like this evaluated.  Pain is moderate, persistent.  I reviewed the past medical history, family history, social history, surgical history, and allergies today and no changes were needed.  Please see the problem list section below in epic for further details.  Past Medical History: No past medical history on file. Past Surgical History: No past surgical history on file. Social History: Social History   Socioeconomic History  . Marital status: Divorced    Spouse name: Not on file  . Number of children: Not on file  . Years of education: Not on file  . Highest education level: Not on file  Occupational History  . Not on file  Social Needs  . Financial resource strain: Not on file  . Food insecurity:    Worry: Not on file    Inability: Not on file  . Transportation needs:    Medical: Not on file    Non-medical: Not on file  Tobacco Use  . Smoking status: Never Smoker  . Smokeless tobacco: Never Used  Substance and Sexual Activity  . Alcohol use: Not Currently  . Drug use: Never  . Sexual activity: Not on file  Lifestyle  . Physical activity:    Days per week: Not on file    Minutes per session: Not on file  . Stress: Not on file  Relationships  . Social connections:    Talks on phone: Not on file    Gets together: Not on file    Attends religious service: Not on file    Active member of club or organization: Not on file    Attends meetings of clubs or organizations: Not on file    Relationship status: Not on file  Other Topics Concern  . Not on file  Social History Narrative  . Not on file   Family History: No family history on file. Allergies: No Known  Allergies Medications: See med rec.  Review of Systems: No fevers, chills, night sweats, weight loss, chest pain, or shortness of breath.   Objective:    General: Well Developed, well nourished, and in no acute distress.  Neuro: Alert and oriented x3, extra-ocular muscles intact, sensation grossly intact.  HEENT: Normocephalic, atraumatic, pupils equal round reactive to light, neck supple, no masses, no lymphadenopathy, thyroid nonpalpable.  Skin: Warm and dry, no rashes. Cardiac: Regular rate and rhythm, no murmurs rubs or gallops, no lower extremity edema.  Respiratory: Clear to auscultation bilaterally. Not using accessory muscles, speaking in full sentences. Left knee: Normal to inspection with no erythema or effusion or obvious bony abnormalities. Palpation normal with no warmth or joint line tenderness or patellar tenderness or condyle tenderness. Range of motion from 0 to 90 degrees, crepitus over the anterolateral joint line with the range of motion. Ligaments with solid consistent endpoints including ACL, PCL, LCL, MCL. Negative Mcmurray's and provocative meniscal tests. Non painful patellar compression. Patellar and quadriceps tendons unremarkable. Hamstring and quadriceps strength is normal.  X-rays show unremarkable prosthesis with possible joint effusion  Impression and Recommendations:    History of arthroplasty of both knees Left knee arthroplasty was about 4 months ago, new onset pain, lateral joint  line. Crepitus on range of motion. We will start conservatively with NSAIDs, icing, range of motion exercises, he does need new set of x-rays. No fevers or chills or warmth to suggest infection. If insufficient relief in 2 weeks we will do an arthrocentesis and injection. ___________________________________________ Terry Greene. Terry Greene, M.D., ABFM., CAQSM. Primary Care and Sports Medicine [facility]  Adjunct Instructor of Family Medicine    [employer]

## 2018-04-30 NOTE — Assessment & Plan Note (Signed)
Left knee arthroplasty was about 4 months ago, new onset pain, lateral joint line. Crepitus on range of motion. We will start conservatively with NSAIDs, icing, range of motion exercises, he does need new set of x-rays. No fevers or chills or warmth to suggest infection. If insufficient relief in 2 weeks we will do an arthrocentesis and injection.

## 2018-05-14 ENCOUNTER — Ambulatory Visit: Payer: PRIVATE HEALTH INSURANCE | Admitting: Sports Medicine

## 2018-05-14 DIAGNOSIS — Z96653 Presence of artificial knee joint, bilateral: Secondary | ICD-10-CM

## 2018-05-14 DIAGNOSIS — M25562 Pain in left knee: Secondary | ICD-10-CM

## 2018-05-14 NOTE — Progress Notes (Signed)
Subjective:    CC: Persistent left knee discomfort  HPI: Terry Greene returns, he is a pleasant 62 year old male, he is post left knee arthroplasty approximately 4-1/2 months ago.  Recently he started to have pain over the lateral joint line, moderate, persistent, we tried conservative measures without much improvement.  He has no fevers, chills, constitutional symptoms.  I reviewed the past medical history, family history, social history, surgical history, and allergies today and no changes were needed.  Please see the problem list section below in epic for further details.  Past Medical History: No past medical history on file. Past Surgical History: No past surgical history on file. Social History: Social History   Socioeconomic History  . Marital status: Divorced    Spouse name: Not on file  . Number of children: Not on file  . Years of education: Not on file  . Highest education level: Not on file  Occupational History  . Not on file  Social Needs  . Financial resource strain: Not on file  . Food insecurity:    Worry: Not on file    Inability: Not on file  . Transportation needs:    Medical: Not on file    Non-medical: Not on file  Tobacco Use  . Smoking status: Never Smoker  . Smokeless tobacco: Never Used  Substance and Sexual Activity  . Alcohol use: Not Currently  . Drug use: Never  . Sexual activity: Not on file  Lifestyle  . Physical activity:    Days per week: Not on file    Minutes per session: Not on file  . Stress: Not on file  Relationships  . Social connections:    Talks on phone: Not on file    Gets together: Not on file    Attends religious service: Not on file    Active member of club or organization: Not on file    Attends meetings of clubs or organizations: Not on file    Relationship status: Not on file  Other Topics Concern  . Not on file  Social History Narrative  . Not on file   Family History: No family history on file. Allergies: No  Known Allergies Medications: See med rec.  Review of Systems: No fevers, chills, night sweats, weight loss, chest pain, or shortness of breath.   Objective:    General: Well Developed, well nourished, and in no acute distress.  Neuro: Alert and oriented x3, extra-ocular muscles intact, sensation grossly intact.  HEENT: Normocephalic, atraumatic, pupils equal round reactive to light, neck supple, no masses, no lymphadenopathy, thyroid nonpalpable.  Skin: Warm and dry, no rashes. Cardiac: Regular rate and rhythm, no murmurs rubs or gallops, no lower extremity edema.  Respiratory: Clear to auscultation bilaterally. Not using accessory muscles, speaking in full sentences.  Procedure: Real-time Ultrasound Guided aspiration/injection of left knee post arthroplasty Device: GE Logiq E  Verbal informed consent obtained.  Time-out conducted.  Noted no overlying erythema, induration, or other signs of local infection.  Skin prepped in a sterile fashion.  Local anesthesia: Topical Ethyl chloride.  With sterile technique and under real time ultrasound guidance: Using an 18-gauge needle advanced into the suprapatellar recess, aspirated approximately 15 mL of clear, straw-colored fluid, syringe switched and 1 cc Kenalog 40, 2 cc lidocaine, 2 cc bupivacaine injected easily. Completed without difficulty  Pain immediately resolved suggesting accurate placement of the medication.  Advised to call if fevers/chills, erythema, induration, drainage, or persistent bleeding.  Images permanently stored and available for review  in the ultrasound unit.  Impression: Technically successful ultrasound guided injection.  Impression and Recommendations:    History of arthroplasty of both knees Left knee arthroplasty was approximately 4 to 5 months ago. X-rays are unremarkable, conservative measures have not been effective, aspiration and injection today. Crystal analysis, cell counts, cultures. Return in 1  month. ___________________________________________ Ihor Austin. Benjamin Stain, M.D., ABFM., CAQSM. Primary Care and Sports Medicine [facility]  Adjunct Instructor of Family Medicine  [employer]

## 2018-05-14 NOTE — Assessment & Plan Note (Addendum)
Left knee arthroplasty was approximately 4 to 5 months ago. X-rays are unremarkable, conservative measures have not been effective, aspiration and injection today. Crystal analysis, cell counts, cultures. Return in 1 month.

## 2018-05-15 LAB — CELL COUNT + DIFF, W/O CRYST-SYNVL FLD
Basophils, %: 0 %
Eosinophils-Synovial: 0 % (ref 0–2)
Lymphocytes-Synovial Fld: 21 % (ref 0–74)
Monocyte/Macrophage: 30 % (ref 0–69)
Neutrophil, Synovial: 49 % — ABNORMAL HIGH (ref 0–24)
Synoviocytes, %: 0 % (ref 0–15)
WBC, Synovial: 17 cells/uL (ref <150)

## 2018-05-15 LAB — SYNOVIAL FLUID, CRYSTAL

## 2018-05-20 LAB — ANAEROBIC AND AEROBIC CULTURE
AER RESULT:: NO GROWTH
MICRO NUMBER:: 90929482
MICRO NUMBER:: 90929483
SPECIMEN QUALITY:: ADEQUATE
SPECIMEN QUALITY:: ADEQUATE

## 2018-05-31 ENCOUNTER — Other Ambulatory Visit: Payer: Self-pay | Admitting: Sports Medicine

## 2018-05-31 DIAGNOSIS — M170 Bilateral primary osteoarthritis of knee: Secondary | ICD-10-CM

## 2018-06-04 ENCOUNTER — Ambulatory Visit: Payer: PRIVATE HEALTH INSURANCE | Admitting: Family Medicine

## 2018-06-11 ENCOUNTER — Ambulatory Visit: Payer: PRIVATE HEALTH INSURANCE | Admitting: Sports Medicine

## 2018-06-11 ENCOUNTER — Encounter: Payer: Self-pay | Admitting: Sports Medicine

## 2018-06-11 DIAGNOSIS — Z96653 Presence of artificial knee joint, bilateral: Secondary | ICD-10-CM

## 2018-06-11 NOTE — Progress Notes (Signed)
Subjective:    CC: Recheck knee  HPI: Left knee pain: Left knee arthroplasty was approximately 6 months ago, still having a bit of discomfort in the lateral/anterior joint line.  He did have some swelling so we did an arthrocentesis, cultures, Gram stain, crystal analysis was all normal.  I did an injection as well, he had some relief, but still has some discomfort.  Still significantly better than before his arthroplasty.  I reviewed the past medical history, family history, social history, surgical history, and allergies today and no changes were needed.  Please see the problem list section below in epic for further details.  Past Medical History: No past medical history on file. Past Surgical History: No past surgical history on file. Social History: Social History   Socioeconomic History  . Marital status: Divorced    Spouse name: Not on file  . Number of children: Not on file  . Years of education: Not on file  . Highest education level: Not on file  Occupational History  . Not on file  Social Needs  . Financial resource strain: Not on file  . Food insecurity:    Worry: Not on file    Inability: Not on file  . Transportation needs:    Medical: Not on file    Non-medical: Not on file  Tobacco Use  . Smoking status: Never Smoker  . Smokeless tobacco: Never Used  Substance and Sexual Activity  . Alcohol use: Not Currently  . Drug use: Never  . Sexual activity: Not on file  Lifestyle  . Physical activity:    Days per week: Not on file    Minutes per session: Not on file  . Stress: Not on file  Relationships  . Social connections:    Talks on phone: Not on file    Gets together: Not on file    Attends religious service: Not on file    Active member of club or organization: Not on file    Attends meetings of clubs or organizations: Not on file    Relationship status: Not on file  Other Topics Concern  . Not on file  Social History Narrative  . Not on file    Family History: No family history on file. Allergies: No Known Allergies Medications: See med rec.  Review of Systems: No fevers, chills, night sweats, weight loss, chest pain, or shortness of breath.   Objective:    General: Well Developed, well nourished, and in no acute distress.  Neuro: Alert and oriented x3, extra-ocular muscles intact, sensation grossly intact.  HEENT: Normocephalic, atraumatic, pupils equal round reactive to light, neck supple, no masses, no lymphadenopathy, thyroid nonpalpable.  Skin: Warm and dry, no rashes. Cardiac: Regular rate and rhythm, no murmurs rubs or gallops, no lower extremity edema.  Respiratory: Clear to auscultation bilaterally. Not using accessory muscles, speaking in full sentences. Left knee: Normal to inspection with no erythema or effusion or obvious bony abnormalities. Palpation normal with no warmth or joint line tenderness or patellar tenderness or condyle tenderness. ROM normal in flexion and extension and lower leg rotation. Ligaments with solid consistent endpoints including ACL, PCL, LCL, MCL. Negative Mcmurray's and provocative meniscal tests. Non painful patellar compression. Patellar and quadriceps tendons unremarkable. Hamstring and quadriceps strength is normal.  Impression and Recommendations:    History of arthroplasty of both knees Left knee arthroplasty was approximately 6 months ago, still having a bit of discomfort in the lateral/anterior joint line. He did have some swelling  so we did an arthrocentesis, cultures, Gram stain, crystal analysis was all normal. I did an injection as well, he had some relief, but still has some discomfort.  Still significantly better than before his arthroplasty. I think he simply needs to give this more time, his arthroplasty was done in [place]. He can return to see me in March of next year. ___________________________________________ Ihor Austin. Benjamin Stain, M.D., ABFM.,  CAQSM. Primary Care and Sports Medicine [place] [facility]  Adjunct Instructor of Family Medicine  [employer]

## 2018-06-11 NOTE — Assessment & Plan Note (Signed)
Left knee arthroplasty was approximately 6 months ago, still having a bit of discomfort in the lateral/anterior joint line. He did have some swelling so we did an arthrocentesis, cultures, Gram stain, crystal analysis was all normal. I did an injection as well, he had some relief, but still has some discomfort.  Still significantly better than before his arthroplasty. I think he simply needs to give this more time, his arthroplasty was done in [place]. He can return to see me in March of next year.

## 2018-08-19 ENCOUNTER — Other Ambulatory Visit: Payer: Self-pay | Admitting: Sports Medicine

## 2018-09-17 ENCOUNTER — Other Ambulatory Visit: Payer: Self-pay | Admitting: Sports Medicine

## 2018-09-17 DIAGNOSIS — M170 Bilateral primary osteoarthritis of knee: Secondary | ICD-10-CM

## 2019-02-07 ENCOUNTER — Other Ambulatory Visit: Payer: Self-pay | Admitting: Sports Medicine

## 2019-03-18 ENCOUNTER — Encounter: Payer: Self-pay | Admitting: Sports Medicine

## 2019-03-18 ENCOUNTER — Other Ambulatory Visit: Payer: Self-pay

## 2019-03-18 ENCOUNTER — Ambulatory Visit (INDEPENDENT_AMBULATORY_CARE_PROVIDER_SITE_OTHER): Payer: PRIVATE HEALTH INSURANCE

## 2019-03-18 ENCOUNTER — Ambulatory Visit: Payer: PRIVATE HEALTH INSURANCE | Admitting: Sports Medicine

## 2019-03-18 DIAGNOSIS — M5030 Other cervical disc degeneration, unspecified cervical region: Secondary | ICD-10-CM

## 2019-03-18 MED ORDER — KETOROLAC TROMETHAMINE 30 MG/ML IJ SOLN
30.0000 mg | Freq: Once | INTRAMUSCULAR | Status: AC
  Administered 2019-03-18: 14:00:00 30 mg via INTRAMUSCULAR

## 2019-03-18 MED ORDER — PREDNISONE 50 MG PO TABS: ORAL_TABLET | ORAL | 0 refills | Status: DC

## 2019-03-18 MED ORDER — HYDROCODONE-ACETAMINOPHEN 5-325 MG PO TABS: 1.0000 | ORAL_TABLET | Freq: Three times a day (TID) | ORAL | 0 refills | Status: DC | PRN

## 2019-03-18 NOTE — Assessment & Plan Note (Signed)
With radiation into the right trapezius. Prednisone, hydrocodone, Toradol 30 intramuscular. X-rays, formal physical therapy, return to see me in 4 to 6 weeks, MR for interventional planning if no better.

## 2019-03-18 NOTE — Progress Notes (Signed)
Subjective:    CC: Neck pain  HPI: For the past few weeks this pleasant 63 year old male with a history of lumbar DDD has had severe pain in the right side of his neck, radiation to the trapezius.  He was seen at an urgent care and had several right-sided trapezial trigger point injections without efficacy.  Pain is moderate, persistent now, no progressive weakness, nothing overtly radicular down to the hand or fingertips.  I reviewed the past medical history, family history, social history, surgical history, and allergies today and no changes were needed.  Please see the problem list section below in epic for further details.  Past Medical History: No past medical history on file. Past Surgical History: No past surgical history on file. Social History: Social History   Socioeconomic History  . Marital status: Divorced    Spouse name: Not on file  . Number of children: Not on file  . Years of education: Not on file  . Highest education level: Not on file  Occupational History  . Not on file  Social Needs  . Financial resource strain: Not on file  . Food insecurity:    Worry: Not on file    Inability: Not on file  . Transportation needs:    Medical: Not on file    Non-medical: Not on file  Tobacco Use  . Smoking status: Never Smoker  . Smokeless tobacco: Never Used  Substance and Sexual Activity  . Alcohol use: Not Currently  . Drug use: Never  . Sexual activity: Not on file  Lifestyle  . Physical activity:    Days per week: Not on file    Minutes per session: Not on file  . Stress: Not on file  Relationships  . Social connections:    Talks on phone: Not on file    Gets together: Not on file    Attends religious service: Not on file    Active member of club or organization: Not on file    Attends meetings of clubs or organizations: Not on file    Relationship status: Not on file  Other Topics Concern  . Not on file  Social History Narrative  . Not on file    Family History: No family history on file. Allergies: No Known Allergies Medications: See med rec.  Review of Systems: No fevers, chills, night sweats, weight loss, chest pain, or shortness of breath.   Objective:    General: Well Developed, well nourished, and in no acute distress.  Neuro: Alert and oriented x3, extra-ocular muscles intact, sensation grossly intact.  HEENT: Normocephalic, atraumatic, pupils equal round reactive to light, neck supple, no masses, no lymphadenopathy, thyroid nonpalpable.  Skin: Warm and dry, no rashes. Cardiac: Regular rate and rhythm, no murmurs rubs or gallops, no lower extremity edema.  Respiratory: Clear to auscultation bilaterally. Not using accessory muscles, speaking in full sentences. Neck: Negative spurling's Full neck range of motion Grip strength and sensation normal in bilateral hands Strength good C4 to T1 distribution No sensory change to C4 to T1 Reflexes normal  Toradol 30 mg given intramuscular.  Impression and Recommendations:    DDD (degenerative disc disease), cervical With radiation into the right trapezius. Prednisone, hydrocodone, Toradol 30 intramuscular. X-rays, formal physical therapy, return to see me in 4 to 6 weeks, MR for interventional planning if no better.   ___________________________________________ Gwen Her. [name], M.D., ABFM., CAQSM. Primary Care and Sports Medicine [place] [facility]  Adjunct Professor of [place]  [employer]

## 2019-03-24 ENCOUNTER — Other Ambulatory Visit: Payer: Self-pay

## 2019-03-24 ENCOUNTER — Ambulatory Visit: Payer: PRIVATE HEALTH INSURANCE | Admitting: Physical Therapy

## 2019-03-24 ENCOUNTER — Encounter: Payer: Self-pay | Admitting: Physical Therapy

## 2019-03-24 DIAGNOSIS — R29898 Other symptoms and signs involving the musculoskeletal system: Secondary | ICD-10-CM | POA: Diagnosis not present

## 2019-03-24 DIAGNOSIS — M542 Cervicalgia: Secondary | ICD-10-CM | POA: Diagnosis not present

## 2019-03-24 DIAGNOSIS — R293 Abnormal posture: Secondary | ICD-10-CM | POA: Diagnosis not present

## 2019-03-24 NOTE — Therapy (Signed)
[facility] [address] [place], [place], [address] Phone: [phone]   Fax:  [phone]  Physical Therapy Evaluation  Patient Details  Name: Terry Greene MRN: [id number] Date of Birth: May 23, 1956 Referring Provider (PT): Monica Bectonhekkekandam, Thomas J, MD   Encounter Date: 03/24/2019  PT End of Session - 03/24/19 1609    Visit Number  1    Number of Visits  12    Date for PT Re-Evaluation  05/05/19    PT Start Time  1527    PT Stop Time  1600    PT Time Calculation (min)  33 min    Equipment Utilized During Treatment  Gait belt    Activity Tolerance  Patient tolerated treatment well    Behavior During Therapy  [facility] for tasks assessed/performed       History reviewed. No pertinent past medical history.  History reviewed. No pertinent surgical history.  There were no vitals filed for this visit.   Subjective Assessment - 03/24/19 1530    Subjective  Pt is a 63 y/o male who presents to OPPT for Rt sided neck pain x 2 weeks without known injury.  Pt went to UC, and reports no significant improvement in symptoms.  Pt then followed up with sports med MD, diagnosed with DDD and possible bulging disc.  Pt states movement to Lt is okay but worse to Rt.    Diagnostic tests  x ray: DDD    Patient Stated Goals  improve pain    Currently in Pain?  Yes    Pain Score  3    up to 10/10; at best 2/10   Pain Location  Neck    Pain Orientation  Right;Posterior    Pain Descriptors / Indicators  Aching;Tiring;Tender    Pain Type  Acute pain    Pain Radiating Towards  Lt scapula    Pain Onset  1 to 4 weeks ago    Pain Frequency  Constant    Aggravating Factors   turning head to Rt; heaviness in head at end of day    Pain Relieving Factors  ice, medication         [facility] PT Assessment - 03/24/19 1534      Assessment   Medical Diagnosis  M50.30 (ICD-10-CM) - DDD (degenerative disc disease), cervical    Referring Provider (PT)   Monica Bectonhekkekandam, Thomas J, MD    Onset Date/Surgical Date  03/09/19    Hand Dominance  Right    Next MD Visit  04/15/2019    Prior Therapy  at this clinic; unrelated to neck      Precautions   Precautions  None      Restrictions   Weight Bearing Restrictions  No      Balance Screen   Has the patient fallen in the past 6 months  No    Has the patient had a decrease in activity level because of a fear of falling?   No    Is the patient reluctant to leave their home because of a fear of falling?   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    Leisure  golfing, hunting, was going to gym 3x/wk  Cognition   Overall Cognitive Status  Within Functional Limits for tasks assessed      Observation/Other Assessments   Focus on Therapeutic Outcomes (FOTO)   63 (37% limited; predicted 24% limited)      Posture/Postural Control   Posture/Postural Control  Postural limitations    Postural Limitations  Rounded Shoulders;Forward head      ROM / Strength   AROM / PROM / Strength  AROM;Strength      AROM   AROM Assessment Site  Cervical    Cervical Flexion  54    Cervical Extension  33    Cervical - Right Side Bend  21   with pain   Cervical - Left Side Bend  29    Cervical - Right Rotation  44   with pain   Cervical - Left Rotation  63      Strength   Overall Strength Comments  bil UEs 5/5    Strength Assessment Site  --      Palpation   Palpation comment  active trigger points noted along Lt upper trap and into rhomboids and levator scapula      Special Tests    Special Tests  Cervical    Cervical Tests  Spurling's;Dictraction      Spurling's   Findings  Negative      Distraction Test   Findngs  Negative                Objective measurements  completed on examination: See above findings.      [facility] - 03/24/19 1534      Exercises   Exercises  Neck      Neck Exercises: Seated   Shoulder Rolls  Backwards;10 reps    Other Seated Exercise  scapular retraction x 10 reps      Neck Exercises: Supine   Neck Retraction  10 reps;5 secs      Neck Exercises: Stretches   Upper Trapezius Stretch  Right;2 reps;30 seconds             PT Education - 03/24/19 1609    Education Details  HEP, DN    Person(s) Educated  Patient    Methods  Explanation;Demonstration;Handout    Comprehension  Verbalized understanding;Returned demonstration;Need further instruction          PT Long Term Goals - 03/24/19 1613      PT LONG TERM GOAL #1   Title  independent with HEP    Status  New    Target Date  05/05/19      PT LONG TERM GOAL #2   Title  report pain < 4/10 with activity for improved function    Status  New    Target Date  05/05/19      PT LONG TERM GOAL #3   Title  Rt cervical rotation improved at least 10 degrees without increase in pain for improved function    Status  New    Target Date  05/05/19      PT LONG TERM GOAL #4   Title  Improve FOTO to </= 24% limitation    Status  New    Target Date  05/05/19      PT LONG TERM GOAL #5   Title  n/a             Plan - 03/24/19 1609    Clinical Impression Statement  Pt is a 63 y/o male who presents to OPPT for acute  onset of Rt sided neck pain.  Pt with active trigger points along upper trap, and recommend DN but pt would like to think about it for possibly next session.  Pt demonstrates postural abnormalities and decreased ROM affecting functional mobility.  Pt will benefit from PT to address deficits listed.    Personal Factors and Comorbidities  Comorbidity 2;Past/Current Experience    Comorbidities  DDD, arthritis, prior hx of DN    Examination-Activity Limitations  Lift;Reach Overhead    Examination-Participation Restrictions   Other   work   Stability/Clinical Decision Making  Stable/Uncomplicated    Clinical Decision Making  Low    Rehab Potential  Good    PT Frequency  2x / week    PT Duration  6 weeks    PT Treatment/Interventions  ADLs/Self Care Home Management;Cryotherapy;Electrical Stimulation;Moist Heat;Iontophoresis 4mg /ml Dexamethasone;Traction;Therapeutic exercise;Therapeutic activities;Ultrasound;Patient/family education;Manual techniques;Taping;Dry needling;Passive range of motion    PT Next Visit Plan  review HEP, DN/manual therapy, continue posture exercises, modalities PRN    PT Home Exercise Plan  Access Code: [email or web address]    Consulted and Agree with Plan of Care  Patient       Patient will benefit from skilled therapeutic intervention in order to improve the following deficits and impairments:  Increased fascial restricitons, Increased muscle spasms, Pain, Postural dysfunction, Impaired flexibility, Decreased range of motion  Visit Diagnosis: 1. Cervicalgia   2. Other symptoms and signs involving the musculoskeletal system   3. Abnormal posture        Problem List Patient Active Problem List   Diagnosis Date Noted  . DDD (degenerative disc disease), cervical 03/18/2019  . Left lumbar radiculitis 10/23/2017  . History of arthroplasty of both knees 11/02/2015      Laureen Abrahams, PT, DPT 03/24/19 4:18 PM     [facility] [place], [place], [address] Phone: [phone]   Fax:  [phone]  Name: Terry Greene MRN: [id number] Date of Birth: 1956-05-29

## 2019-03-24 NOTE — Patient Instructions (Signed)
Access Code: [email or web address]  URL: [email or web address]  Date: 03/24/2019  Prepared by: Faustino Congress   Exercises  Supine Chin Tuck - 10 reps - 1 sets - 5 sec hold - 2x daily - 7x weekly  Standing Backward Shoulder Rolls - 10 reps - 1 sets - 2x daily - 7x weekly  Seated Scapular Retraction - 10 reps - 1 sets - 5 sec hold - 2x daily - 7x weekly  Seated Upper Trapezius Stretch - 3 reps - 1 sets - 30 sec hold - 2x daily - 7x weekly  Patient Education  Trigger Point Dry Needling

## 2019-03-27 ENCOUNTER — Ambulatory Visit: Payer: PRIVATE HEALTH INSURANCE | Admitting: Physical Therapy

## 2019-03-27 ENCOUNTER — Other Ambulatory Visit: Payer: Self-pay

## 2019-03-27 ENCOUNTER — Encounter: Payer: Self-pay | Admitting: Physical Therapy

## 2019-03-27 DIAGNOSIS — M542 Cervicalgia: Secondary | ICD-10-CM

## 2019-03-27 DIAGNOSIS — R293 Abnormal posture: Secondary | ICD-10-CM

## 2019-03-27 DIAGNOSIS — R29898 Other symptoms and signs involving the musculoskeletal system: Secondary | ICD-10-CM | POA: Diagnosis not present

## 2019-03-27 NOTE — Therapy (Signed)
[place], [place], [address] Phone: [phone]   Fax:  [phone]  Physical Therapy Treatment  Patient Details  Name: Terry Greene MRN: [id number] Date of Birth: 1955-12-04 Referring Provider (PT): Silverio Decamp, MD   Encounter Date: 03/27/2019  PT End of Session - 03/27/19 1610    Visit Number  2    Number of Visits  12    Date for PT Re-Evaluation  05/05/19    PT Start Time  9242    PT Stop Time  1624    PT Time Calculation (min)  54 min    Equipment Utilized During Treatment  Gait belt    Activity Tolerance  Patient tolerated treatment well    Behavior During Therapy  [facility] for tasks assessed/performed       History reviewed. No pertinent past medical history.  History reviewed. No pertinent surgical history.  There were no vitals filed for this visit.  Subjective Assessment - 03/27/19 1532    Subjective  neck is "okay." neck seems to do well in the morning, pain occurs near the end of the day.    Diagnostic tests  x ray: DDD    Patient Stated Goals  improve pain    Currently in Pain?  Yes    Pain Score  4     Pain Location  Neck    Pain Orientation  Right;Posterior    Pain Descriptors / Indicators  Aching;Tiring;Tightness;Tender    Pain Type  Acute pain    Pain Onset  1 to 4 weeks ago    Pain Frequency  Constant    Aggravating Factors   turning head to right, heaviness in head at end of day    Pain Relieving Factors  ice, meds         [facility] PT Assessment - 03/27/19 1540      Assessment   Medical Diagnosis  M50.30 (ICD-10-CM) - DDD (degenerative disc disease), cervical    Referring Provider (PT)  Silverio Decamp, MD    Onset Date/Surgical Date  03/09/19    Hand Dominance  Right    Next MD Visit  04/15/2019                   [facility] Adult PT Treatment/Exercise - 03/27/19 1534      Neck Exercises: Machines for Strengthening   UBE (Upper Arm Bike)   L4 x 4 min (2' each direction)      Neck Exercises: Theraband   Shoulder External Rotation  10 reps;Green    Shoulder External Rotation Limitations  supine    Other Theraband Exercises  supine overhead pull with green x 10 reps      Neck Exercises: Supine   Neck Retraction  10 reps;5 secs    Other Supine Exercise  supine scapular retraction x 10 reps      Modalities   Modalities  Electrical Stimulation;Moist Heat      Moist Heat Therapy   Number Minutes Moist Heat  15 Minutes    Moist Heat Location  Cervical      Electrical Stimulation   Electrical Stimulation Location  neck/upper traps    Electrical Stimulation Action  IFC    Electrical Stimulation Parameters  to tolerance     Electrical Stimulation Goals  Pain;Tone      Manual Therapy   Manual Therapy  Soft tissue mobilization    Manual therapy comments  skilled palpation and monitoring of  soft tissue; pt prone and supine    Soft tissue mobilization  STM to bil suboccipitals, cervical paraspinals and upper traps       Trigger Point Dry Needling - 03/27/19 1608    Consent Given?  Yes    Education Handout Provided  Yes    Muscles Treated Head and Neck  Upper trapezius;Splenius capitus;Semispinalis capitus    Upper Trapezius Response  Twitch reponse elicited;Palpable increased muscle length    Splenius capitus Response  Twitch reponse elicited;Palpable increased muscle length    Semispinalis capitus Response  Twitch reponse elicited;Palpable increased muscle length                PT Long Term Goals - 03/24/19 1613      PT LONG TERM GOAL #1   Title  independent with HEP    Status  New    Target Date  05/05/19      PT LONG TERM GOAL #2   Title  report pain < 4/10 with activity for improved function    Status  New    Target Date  05/05/19      PT LONG TERM GOAL #3   Title  Rt cervical rotation improved at least 10 degrees without increase in pain for improved function    Status  New    Target Date   05/05/19      PT LONG TERM GOAL #4   Title  Improve FOTO to </= 24% limitation    Status  New    Target Date  05/05/19      PT LONG TERM GOAL #5   Title  n/a            Plan - 03/27/19 1611    Clinical Impression Statement  Pt tolerated session well today reporting decreased pain with cervical rotation following DN and manual therapy today.  Pt feels as if ROM is increased, not formally measured today but will plan to measure at next visit.  No goals met as only 2nd visit.    Personal Factors and Comorbidities  Comorbidity 2;Past/Current Experience    Comorbidities  DDD, arthritis, prior hx of DN    Examination-Activity Limitations  Lift;Reach Overhead    Examination-Participation Restrictions  Other   work   Stability/Clinical Decision Making  Stable/Uncomplicated    Rehab Potential  Good    PT Frequency  2x / week    PT Duration  6 weeks    PT Treatment/Interventions  ADLs/Self Care Home Management;Cryotherapy;Electrical Stimulation;Moist Heat;Iontophoresis 41m/ml Dexamethasone;Traction;Therapeutic exercise;Therapeutic activities;Ultrasound;Patient/family education;Manual techniques;Taping;Dry needling;Passive range of motion    PT Next Visit Plan  review HEP, DN/manual therapy, continue posture exercises, modalities PRN; assess response to DN/manual    PT Home Exercise Plan  Access Code: [email or web address]   Consulted and Agree with Plan of Care  Patient       Patient will benefit from skilled therapeutic intervention in order to improve the following deficits and impairments:  Increased fascial restricitons, Increased muscle spasms, Pain, Postural dysfunction, Impaired flexibility, Decreased range of motion  Visit Diagnosis: 1. Cervicalgia   2. Other symptoms and signs involving the musculoskeletal system   3. Abnormal posture        Problem List Patient Active Problem List   Diagnosis Date Noted  . DDD (degenerative disc disease), cervical 03/18/2019  . Left lumbar  radiculitis 10/23/2017  . History of arthroplasty of both knees 11/02/2015      SLaureen Abrahams PT, DPT 03/27/19 4:14 PM  [place], [place], [address] Phone: [phone]   Fax:  [phone]  Name: Charlies Rayburn MRN: [id number] Date of Birth: [birth year]

## 2019-03-29 IMAGING — DX DG KNEE COMPLETE 4+V*L*
4 series · 4 of 4 positions shown · non-contrast
Comparison: MR left knee of 10/29/2016

CLINICAL DATA: Left lateral knee pain, he surgery December 2016

EXAM:
LEFT KNEE - COMPLETE 4+ VIEW

[knee ap]
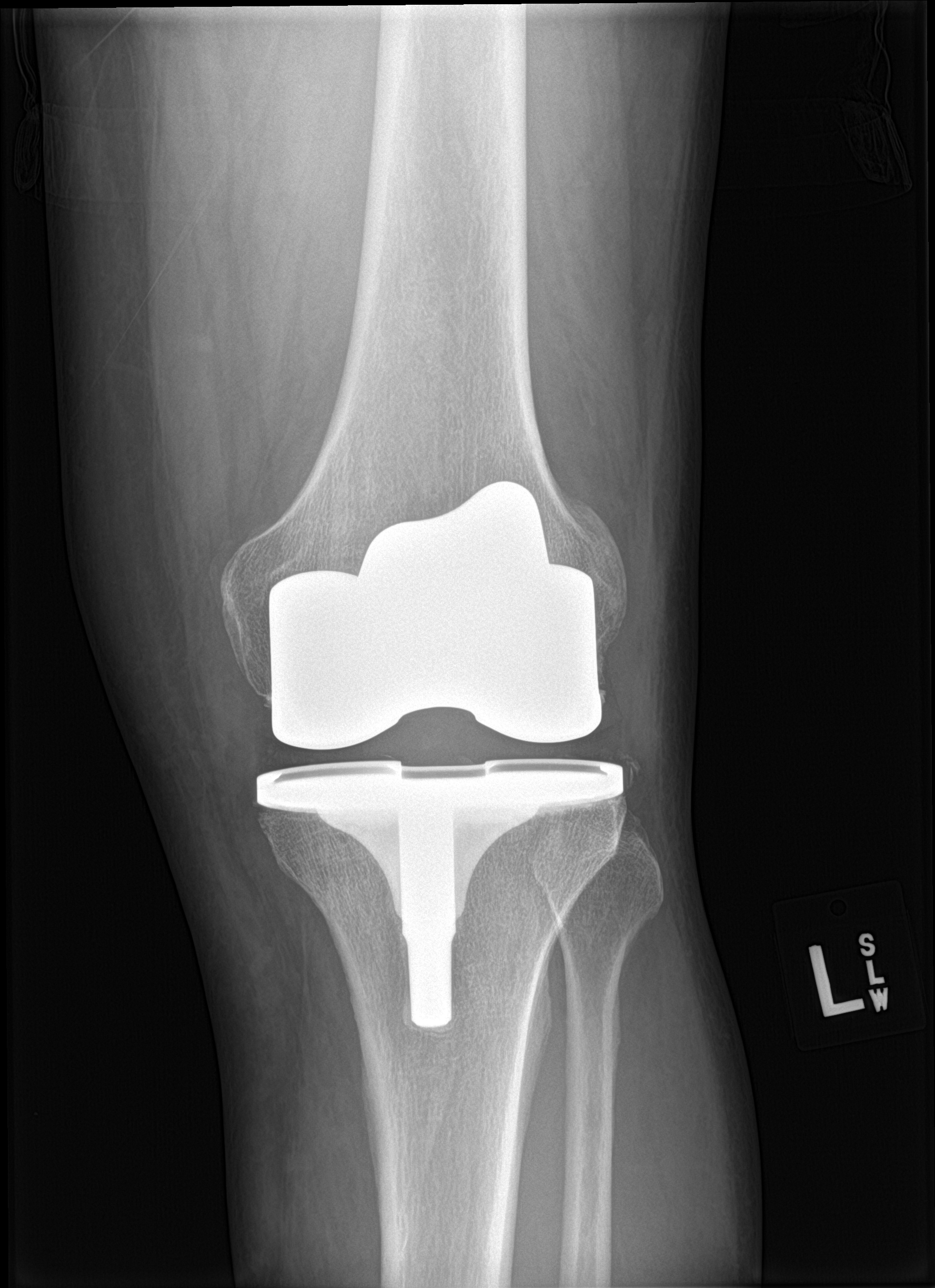

[knee lat]
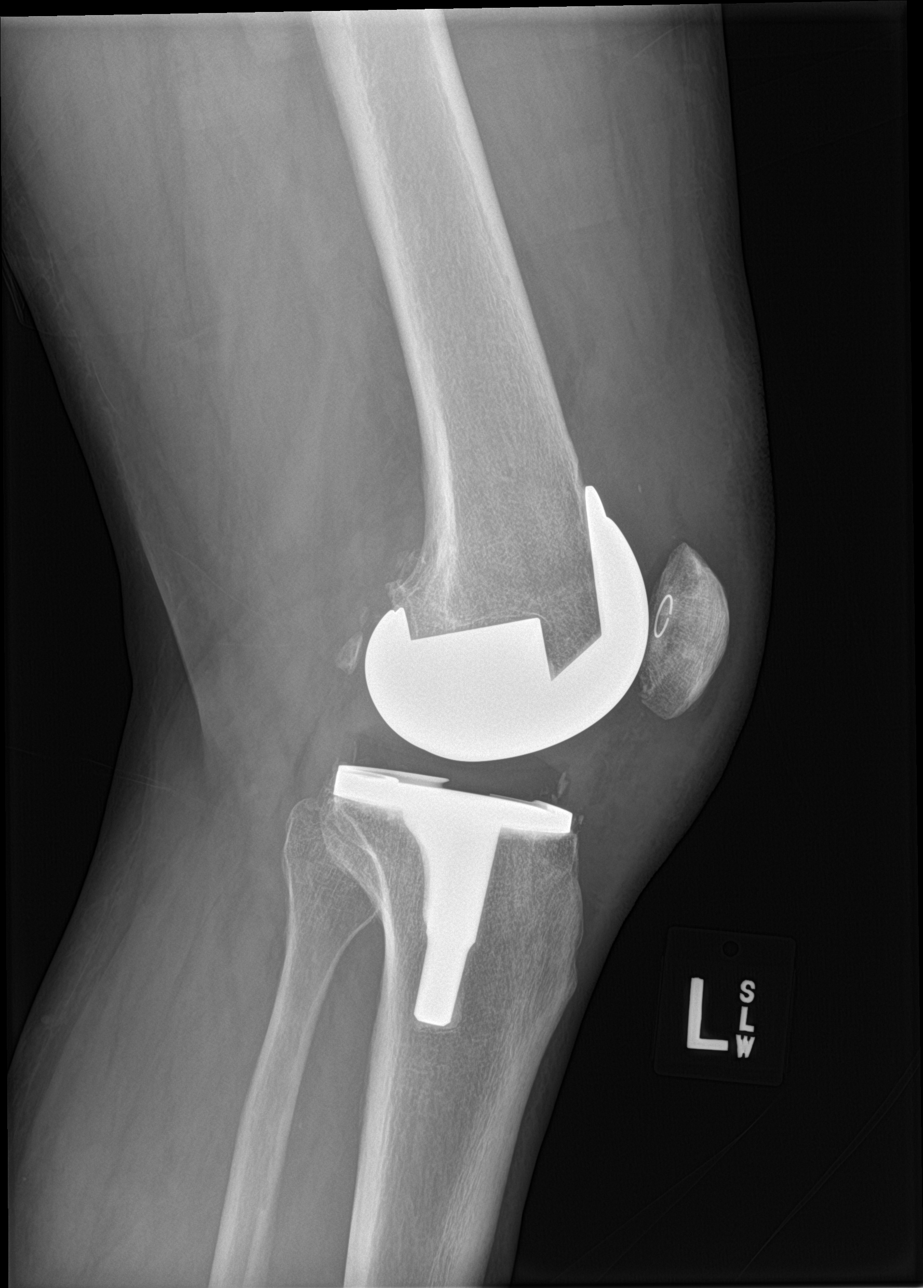

[knee obl (1 of 2)]
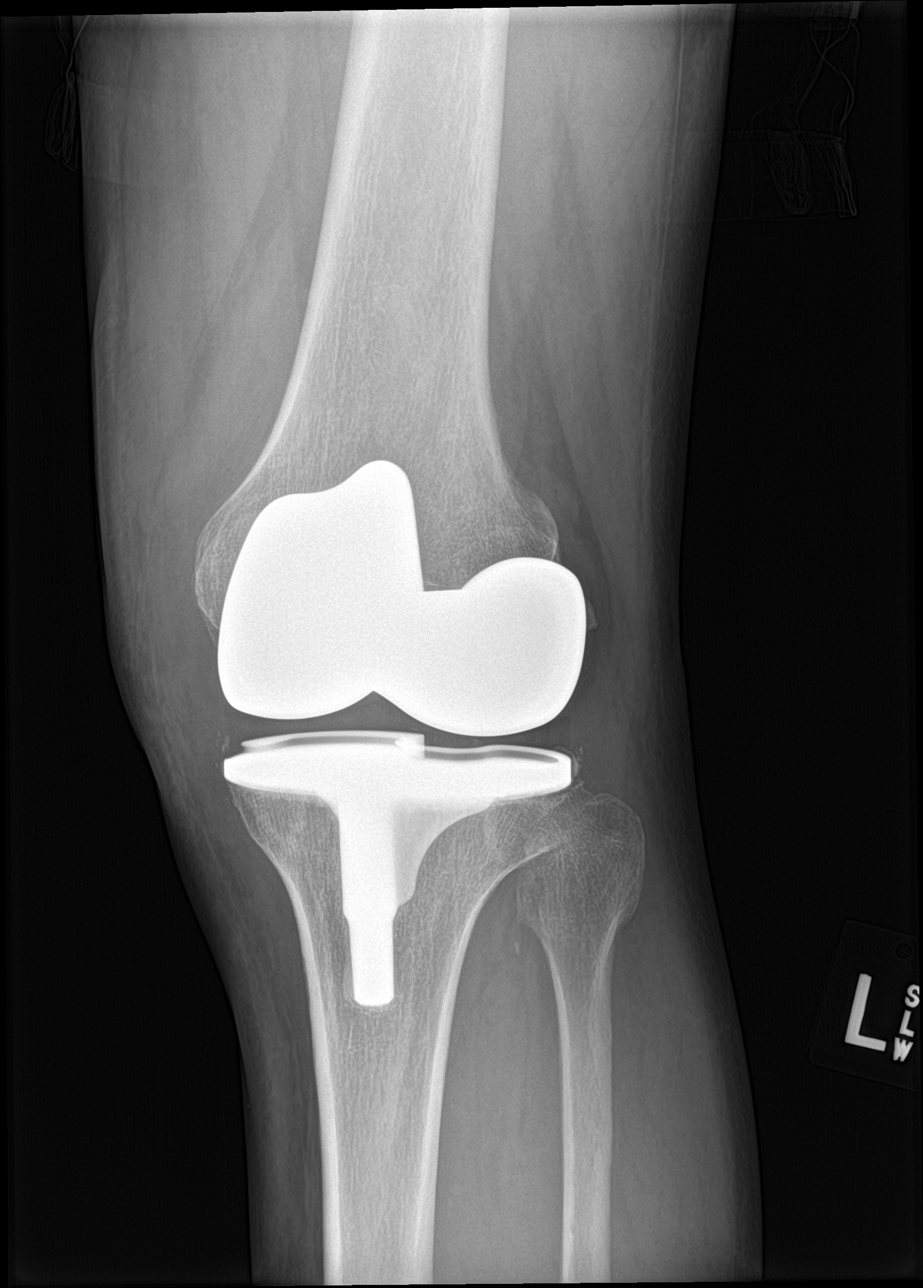

[knee obl (2 of 2)]
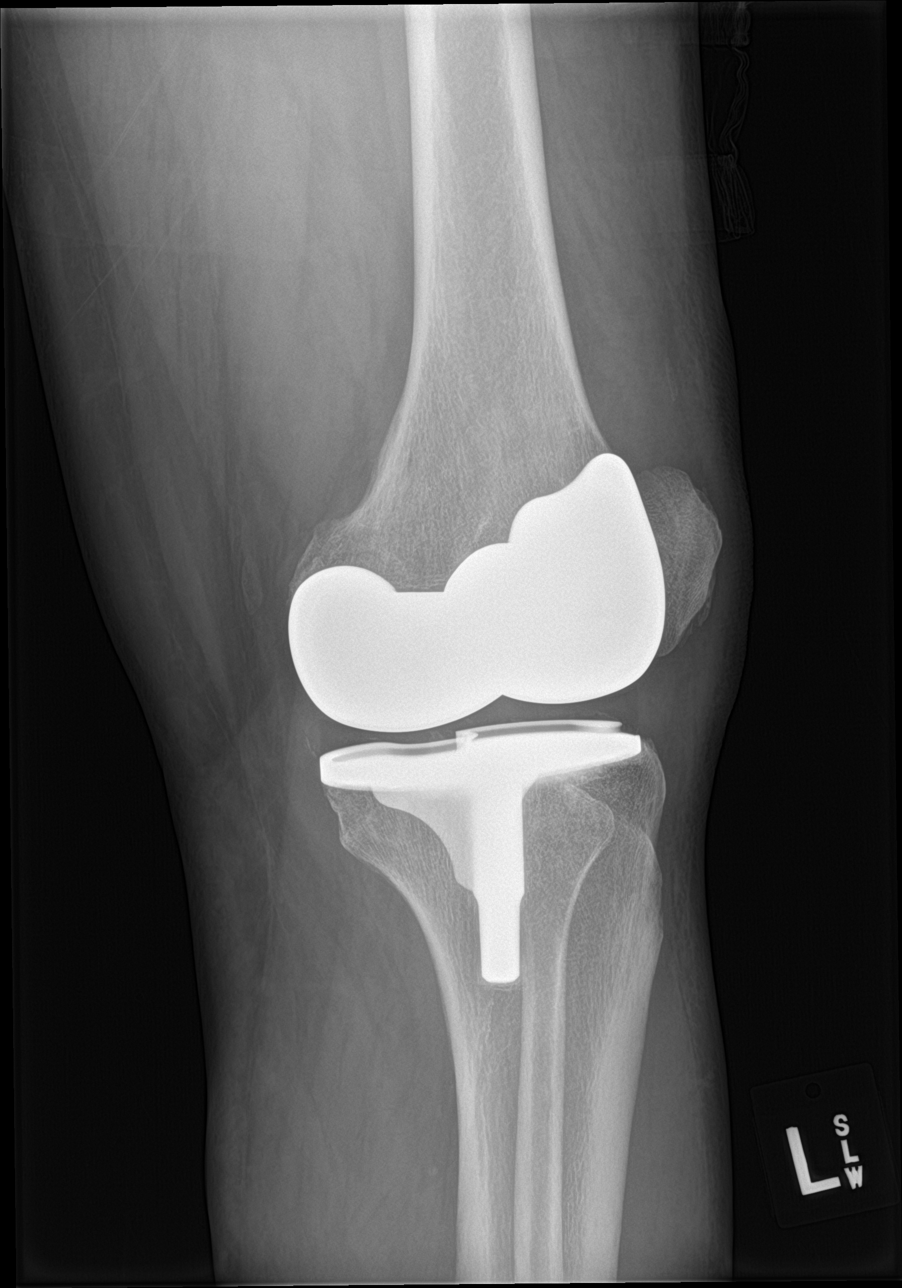

[4 of 4 positions shown; findings below may reference images not displayed]

FINDINGS: The femoral and tibial components of the left total knee replacement
appear to be in good position. No complicating features are seen.
There may be a tiny amount of joint fluid present.
IMPRESSION: No abnormality of the left total knee replacement components.
Possible small left knee joint fluid.

## 2019-04-01 ENCOUNTER — Other Ambulatory Visit: Payer: Self-pay

## 2019-04-01 ENCOUNTER — Ambulatory Visit: Payer: PRIVATE HEALTH INSURANCE | Admitting: Physical Therapy

## 2019-04-01 ENCOUNTER — Encounter: Payer: Self-pay | Admitting: Physical Therapy

## 2019-04-01 DIAGNOSIS — R29898 Other symptoms and signs involving the musculoskeletal system: Secondary | ICD-10-CM | POA: Diagnosis not present

## 2019-04-01 DIAGNOSIS — M542 Cervicalgia: Secondary | ICD-10-CM | POA: Diagnosis not present

## 2019-04-01 DIAGNOSIS — R293 Abnormal posture: Secondary | ICD-10-CM

## 2019-04-01 NOTE — Therapy (Signed)
[facility] [address] [place], [place], [address] Phone: [phone]   Fax:  [phone]  Physical Therapy Treatment  Patient Details  Name: Terry Greene MRN: [id number] Date of Birth: 06/02/1956 Referring Provider (PT): Thekkekandam, Thomas J, MD   Encounter Date: 04/01/2019  PT End of Session - 04/01/19 0815    Visit Number  3    Number of Visits  12    Date for PT Re-Evaluation  05/05/19    PT Start Time  0732    PT Stop Time  0828    PT Time Calculation (min)  56 min    Equipment Utilized During Treatment  Gait belt    Activity Tolerance  Patient tolerated treatment well    Behavior During Therapy  WFL for tasks assessed/performed       History reviewed. No pertinent past medical history.  History reviewed. No pertinent surgical history.  There were no vitals filed for this visit.  Subjective Assessment - 04/01/19 0731    Subjective  still having pain in the afternoon.  doing well this morning. DN seemed to be beneficial - pain is more diffuse and dull now.  less intense    Diagnostic tests  x ray: DDD    Patient Stated Goals  improve pain    Currently in Pain?  Yes    Pain Score  2     Pain Location  Neck    Pain Orientation  Right;Posterior    Pain Descriptors / Indicators  Aching;Tightness;Tiring;Tender    Pain Type  Acute pain    Pain Onset  1 to 4 weeks ago    Pain Frequency  Constant    Aggravating Factors   turning head to Rt, heaviness in head at end of day    Pain Relieving Factors  ice, meds         [facility] PT Assessment - 04/01/19 0805      Assessment   Medical Diagnosis  M50.30 (ICD-10-CM) - DDD (degenerative disc disease), cervical    Referring Provider (PT)  Thekkekandam, Thomas J, MD    Onset Date/Surgical Date  03/09/19    Hand Dominance  Right    Next MD Visit  04/15/2019      AROM   Cervical - Right Rotation  65    Cervical - Left Rotation  65                    [facility] Adult PT Treatment/Exercise - 04/01/19 0735      Neck Exercises: Machines for Strengthening   UBE (Upper Arm Bike)  L4 x 4 min (2' each direction)      Modalities   Modalities  Electrical Stimulation;Moist Heat      Moist Heat Therapy   Number Minutes Moist Heat  15 Minutes    Moist Heat Location  Cervical      Electrical Stimulation   Electrical Stimulation Location  neck/upper traps    Electrical Stimulation Action  IFC    Electrical Stimulation Parameters  to tolerance     Electrical Stimulation Goals  Pain;Tone      Manual Therapy   Manual Therapy  Soft tissue mobilization    Manual therapy comments  skilled palpation and monitoring of soft tissue; pt prone and supine    Soft tissue mobilization  IASTM to bil suboccipitals, cervical paraspinals and upper traps      Neck Exercises: Stretches   Other Neck Stretches  mid and   high doorway stretch 3x30 sec       Trigger Point Dry Needling - 04/01/19 0814    Consent Given?  Yes    Education Handout Provided  Previously provided    Muscles Treated Head and Neck  Upper trapezius;Splenius capitus;Semispinalis capitus;Suboccipitals    Upper Trapezius Response  Twitch reponse elicited;Palpable increased muscle length    SubOccipitals Response  Twitch response elicited;Palpable increased muscle length    Splenius capitus Response  Twitch reponse elicited;Palpable increased muscle length    Semispinalis capitus Response  Twitch reponse elicited;Palpable increased muscle length           PT Education - 04/01/19 0815    Education Details  updated HEP    Person(s) Educated  Patient    Methods  Explanation;Demonstration;Handout    Comprehension  Verbalized understanding          PT Long Term Goals - 04/01/19 0816      PT LONG TERM GOAL #1   Title  independent with HEP    Status  On-going    Target Date  05/05/19      PT LONG TERM GOAL #2   Title  report pain < 4/10 with activity for  improved function    Status  On-going    Target Date  05/05/19      PT LONG TERM GOAL #3   Title  Rt cervical rotation improved at least 10 degrees without increase in pain for improved function    Status  Achieved      PT LONG TERM GOAL #4   Title  Improve FOTO to </= 24% limitation    Status  On-going    Target Date  05/05/19      PT LONG TERM GOAL #5   Title  n/a            Plan - 04/01/19 0816    Clinical Impression Statement  Pt tolerated DN well today and had positive response last visit, with improved ROM and decreased pain overall.  Still with pain in afternoons and evening, but progressing well.  Cervical rotation goal met today.    Personal Factors and Comorbidities  Comorbidity 2;Past/Current Experience    Comorbidities  DDD, arthritis, prior hx of DN    Examination-Activity Limitations  Lift;Reach Overhead    Examination-Participation Restrictions  Other   work   Stability/Clinical Decision Making  Stable/Uncomplicated    Rehab Potential  Good    PT Frequency  2x / week    PT Duration  6 weeks    PT Treatment/Interventions  ADLs/Self Care Home Management;Cryotherapy;Electrical Stimulation;Moist Heat;Iontophoresis 4mg/ml Dexamethasone;Traction;Therapeutic exercise;Therapeutic activities;Ultrasound;Patient/family education;Manual techniques;Taping;Dry needling;Passive range of motion    PT Next Visit Plan  review HEP, DN/manual therapy, continue posture exercises, modalities PRN; assess response to DN/manual    PT Home Exercise Plan  Access Code: [email or web address]    Consulted and Agree with Plan of Care  Patient       Patient will benefit from skilled therapeutic intervention in order to improve the following deficits and impairments:  Increased fascial restricitons, Increased muscle spasms, Pain, Postural dysfunction, Impaired flexibility, Decreased range of motion  Visit Diagnosis: 1. Cervicalgia   2. Other symptoms and signs involving the musculoskeletal system   3.  Abnormal posture        Problem List Patient Active Problem List   Diagnosis Date Noted  . DDD (degenerative disc disease), cervical 03/18/2019  . Left lumbar radiculitis 10/23/2017  . History of arthroplasty of both   knees 11/02/2015      Laureen Abrahams, PT, DPT 04/01/19 8:19 AM    [facility] [place], [place], [address] Phone: [phone]   Fax:  [phone]  Name: Rondal Vandevelde MRN: [id number] Date of Birth: Mar 12, 1956

## 2019-04-01 NOTE — Patient Instructions (Signed)
Access Code: [email or web address]  URL: [email or web address]  Date: 04/01/2019  Prepared by: Faustino Congress   Exercises  Supine Chin Tuck - 10 reps - 1 sets - 5 sec hold - 2x daily - 7x weekly  Standing Backward Shoulder Rolls - 10 reps - 1 sets - 2x daily - 7x weekly  Seated Scapular Retraction - 10 reps - 1 sets - 5 sec hold - 2x daily - 7x weekly  Seated Upper Trapezius Stretch - 3 reps - 1 sets - 30 sec hold - 2x daily - 7x weekly  Doorway Pec Stretch at 90 Degrees Abduction - 3 reps - 1 sets - 30 sec hold - 1x daily - 7x weekly  Doorway Pec Stretch at 120 Degrees Abduction - 3 reps - 1 sets - 30 sec hold - 1x daily - 7x weekly  Patient Education  Trigger Point Dry Needling

## 2019-04-02 ENCOUNTER — Encounter: Payer: PRIVATE HEALTH INSURANCE | Admitting: Physical Therapy

## 2019-04-03 ENCOUNTER — Encounter: Payer: Self-pay | Admitting: Physical Therapy

## 2019-04-03 ENCOUNTER — Other Ambulatory Visit: Payer: Self-pay

## 2019-04-03 ENCOUNTER — Encounter: Payer: PRIVATE HEALTH INSURANCE | Admitting: Physical Therapy

## 2019-04-03 ENCOUNTER — Ambulatory Visit: Payer: PRIVATE HEALTH INSURANCE | Admitting: Physical Therapy

## 2019-04-03 DIAGNOSIS — R293 Abnormal posture: Secondary | ICD-10-CM

## 2019-04-03 DIAGNOSIS — R29898 Other symptoms and signs involving the musculoskeletal system: Secondary | ICD-10-CM

## 2019-04-03 DIAGNOSIS — M542 Cervicalgia: Secondary | ICD-10-CM | POA: Diagnosis not present

## 2019-04-03 NOTE — Therapy (Signed)
[place], [place], [address] Phone: [phone]   Fax:  [phone]  Physical Therapy Treatment  Patient Details  Name: Terry Greene MRN: [id number] Date of Birth: September 17, 1956 Referring Provider (PT): Silverio Decamp, MD   Encounter Date: 04/03/2019  PT End of Session - 04/03/19 1601    Visit Number  4    Number of Visits  12    Date for PT Re-Evaluation  05/05/19    PT Start Time  1601    PT Stop Time  1552    PT Time Calculation (min)  45 min    Equipment Utilized During Treatment  --    Activity Tolerance  Patient tolerated treatment well;No increased pain    Behavior During Therapy  [facility] for tasks assessed/performed       History reviewed. No pertinent past medical history.  History reviewed. No pertinent surgical history.  There were no vitals filed for this visit.  Subjective Assessment - 04/03/19 1509    Subjective  The DN has helped;  he doesn't have the same type or amount of pain like he did before.  He still has pain.  He hasn't taken any medicine for 4 days.    Currently in Pain?  Yes    Pain Score  2     Pain Location  Neck    Pain Orientation  Right    Pain Descriptors / Indicators  Dull;Aching;Tightness    Aggravating Factors   turning head to Rt    Pain Relieving Factors  ice         [facility] PT Assessment - 04/03/19 0001      Assessment   Medical Diagnosis  M50.30 (ICD-10-CM) - DDD (degenerative disc disease), cervical    Referring Provider (PT)  Silverio Decamp, MD    Onset Date/Surgical Date  03/09/19    Hand Dominance  Right    Next MD Visit  04/15/2019       [facility] Adult PT Treatment/Exercise - 04/03/19 0001      Self-Care   Self-Care  Other Self-Care Comments    Other Self-Care Comments   educated pt on self MFR to Rt ant neck muscles; pt returned demo with cues.       Neck Exercises: Machines for Strengthening   UBE (Upper Arm Bike)  L2 min (2'  each direction)      Neck Exercises: Seated   Other Seated Exercise  thoracic ext over back of chair x 10 sec x 3 reps (hands supporting back of head)     Other Seated Exercise  W's with 5 sec hold x 10 reps      Modalities   Modalities  Electrical Stimulation;Ultrasound      Electrical Stimulation   Electrical Stimulation Location  Rt cervical paraspinals, upper trap, scalenes    Electrical Stimulation Action  combo [place]     Electrical Stimulation Parameters  to tolerance    Electrical Stimulation Goals  Tone;Pain      Ultrasound   Ultrasound Location  see estim    Ultrasound Parameters  combo [place] 100%, 1.3 w/cm2, 8 min     Ultrasound Goals  Pain   tightness.      Manual Therapy   Manual therapy comments  pt in prone    Soft tissue mobilization  IASTM and STM to bil suboccipitals, cervical/upper thoracic paraspinals and upper traps, scalenes      Neck Exercises: Stretches   Upper  Trapezius Stretch  Right;3 reps;20 seconds    Levator Stretch  Left;2 reps;20 seconds    Other Neck Stretches  3 position doorway stretch x 30 sec x 3 reps each position                   PT Long Term Goals - 04/01/19 0816      PT LONG TERM GOAL #1   Title  independent with HEP    Status  On-going    Target Date  05/05/19      PT LONG TERM GOAL #2   Title  report pain < 4/10 with activity for improved function    Status  On-going    Target Date  05/05/19      PT LONG TERM GOAL #3   Title  Rt cervical rotation improved at least 10 degrees without increase in pain for improved function    Status  Achieved      PT LONG TERM GOAL #4   Title  Improve FOTO to </= 24% limitation    Status  On-going    Target Date  05/05/19      PT LONG TERM GOAL #5   Title  n/a            Plan - 04/03/19 1602    Clinical Impression Statement  Positive response to DN last session. Pt had some cramping in Lt side of neck with Rt neck stretches.  Pt tolerated all other exercises well.  Pt  reported reduction of pain and tightness in Rt neck at end of session.  Progressing towards goals.    Personal Factors and Comorbidities  Comorbidity 2;Past/Current Experience    Comorbidities  DDD, arthritis, prior hx of DN    Examination-Activity Limitations  Lift;Reach Overhead    Examination-Participation Restrictions  Other   work   Stability/Clinical Decision Making  Stable/Uncomplicated    Rehab Potential  Good    PT Frequency  2x / week    PT Duration  6 weeks    PT Treatment/Interventions  ADLs/Self Care Home Management;Cryotherapy;Electrical Stimulation;Moist Heat;Iontophoresis 4mg /ml Dexamethasone;Traction;Therapeutic exercise;Therapeutic activities;Ultrasound;Patient/family education;Manual techniques;Taping;Dry needling;Passive range of motion    PT Next Visit Plan  review HEP, DN/manual therapy, continue posture exercises, modalities PRN; assess response to DN/manual    PT Home Exercise Plan  Access Code: [email or web address], verbally added W's.    Consulted and Agree with Plan of Care  Patient       Patient will benefit from skilled therapeutic intervention in order to improve the following deficits and impairments:  Increased fascial restricitons, Increased muscle spasms, Pain, Postural dysfunction, Impaired flexibility, Decreased range of motion  Visit Diagnosis: 1. Cervicalgia   2. Other symptoms and signs involving the musculoskeletal system   3. Abnormal posture        Problem List Patient Active Problem List   Diagnosis Date Noted  . DDD (degenerative disc disease), cervical 03/18/2019  . Left lumbar radiculitis 10/23/2017  . History of arthroplasty of both knees 11/02/2015   Mayer CamelJennifer Carlson-Long, PTA 04/03/19 4:16 PM  [facility] [address] [place], [place], [address] Phone: [phone]   Fax:  [phone]  Name: Terry Greene MRN: [id number] Date of Birth: 06/24/1956

## 2019-04-07 ENCOUNTER — Other Ambulatory Visit: Payer: Self-pay

## 2019-04-07 ENCOUNTER — Ambulatory Visit: Payer: PRIVATE HEALTH INSURANCE | Admitting: Physical Therapy

## 2019-04-07 ENCOUNTER — Encounter: Payer: Self-pay | Admitting: Physical Therapy

## 2019-04-07 DIAGNOSIS — R293 Abnormal posture: Secondary | ICD-10-CM | POA: Diagnosis not present

## 2019-04-07 DIAGNOSIS — M542 Cervicalgia: Secondary | ICD-10-CM | POA: Diagnosis not present

## 2019-04-07 DIAGNOSIS — R29898 Other symptoms and signs involving the musculoskeletal system: Secondary | ICD-10-CM | POA: Diagnosis not present

## 2019-04-07 NOTE — Therapy (Signed)
[place], [place], [address] Phone: [phone]   Fax:  [phone]  Physical Therapy Treatment  Patient Details  Name: Terry Greene MRN: [id number] Date of Birth: 12-10-1955 Referring Provider (PT): Silverio Decamp, MD   Encounter Date: 04/07/2019  PT End of Session - 04/07/19 1524    Visit Number  5    Number of Visits  12    Date for PT Re-Evaluation  05/05/19    PT Start Time  1440   pt arrived late   PT Stop Time  1520    PT Time Calculation (min)  40 min    Activity Tolerance  Patient tolerated treatment well;No increased pain    Behavior During Therapy  [facility] for tasks assessed/performed       History reviewed. No pertinent past medical history.  History reviewed. No pertinent surgical history.  There were no vitals filed for this visit.  Subjective Assessment - 04/07/19 1439    Subjective  feels "better."  neck pain is still present but able to function much better. c/o stiffness now    Diagnostic tests  x ray: DDD    Patient Stated Goals  improve pain    Currently in Pain?  Yes    Pain Location  Neck    Pain Orientation  Right    Pain Descriptors / Indicators  Aching;Tightness    Pain Type  Acute pain    Pain Onset  1 to 4 weeks ago    Pain Frequency  Constant    Aggravating Factors   turning head to right    Pain Relieving Factors  ice         [facility] PT Assessment - 04/07/19 1441      Assessment   Medical Diagnosis  M50.30 (ICD-10-CM) - DDD (degenerative disc disease), cervical    Referring Provider (PT)  Silverio Decamp, MD    Onset Date/Surgical Date  03/09/19    Hand Dominance  Right    Next MD Visit  04/15/2019                   [facility] Adult PT Treatment/Exercise - 04/07/19 1442      Neck Exercises: Machines for Strengthening   UBE (Upper Arm Bike)  L3 x 4 min (2' each direction)      Neck Exercises: Seated   Other Seated Exercise   thoracic ext over back of chair x 10 sec x 3 reps (hands supporting back of head)       Neck Exercises: Prone   W Back  10 reps    W Back Limitations  5 sec hold      Manual Therapy   Manual therapy comments  pt supine; skilled palpation and monitoring of soft tissue; pt prone and supine    Soft tissue mobilization  IASTM to upper traps, scalenes      Neck Exercises: Stretches   Other Neck Stretches  3 position doorway stretch x 30 sec x 3 reps each position     Other Neck Stretches  scalene stretch 3x30 sec       Trigger Point Dry Needling - 04/07/19 1522    Consent Given?  Yes    Education Handout Provided  Previously provided    Muscles Treated Head and Neck  Scalenes    Scalenes Response  Twitch reponse elicited;Palpable increased muscle length           PT Education -  04/07/19 1523    Education Details  added scalene stretch    Person(s) Educated  Patient    Methods  Explanation;Demonstration;Handout    Comprehension  Verbalized understanding;Returned demonstration;Need further instruction          PT Long Term Goals - 04/01/19 0816      PT LONG TERM GOAL #1   Title  independent with HEP    Status  On-going    Target Date  05/05/19      PT LONG TERM GOAL #2   Title  report pain < 4/10 with activity for improved function    Status  On-going    Target Date  05/05/19      PT LONG TERM GOAL #3   Title  Rt cervical rotation improved at least 10 degrees without increase in pain for improved function    Status  Achieved      PT LONG TERM GOAL #4   Title  Improve FOTO to </= 24% limitation    Status  On-going    Target Date  05/05/19      PT LONG TERM GOAL #5   Title  n/a            Plan - 04/07/19 1524    Clinical Impression Statement  Pt responded well to DN of scalenes today with decreased active trigger points following.  Slowly progressing towards goals with decrease pain and intensity overall.    Personal Factors and Comorbidities   Comorbidity 2;Past/Current Experience    Comorbidities  DDD, arthritis, prior hx of DN    Examination-Activity Limitations  Lift;Reach Overhead    Examination-Participation Restrictions  Other   work   Stability/Clinical Decision Making  Stable/Uncomplicated    Rehab Potential  Good    PT Frequency  2x / week    PT Duration  6 weeks    PT Treatment/Interventions  ADLs/Self Care Home Management;Cryotherapy;Electrical Stimulation;Moist Heat;Iontophoresis 4mg /ml Dexamethasone;Traction;Therapeutic exercise;Therapeutic activities;Ultrasound;Patient/family education;Manual techniques;Taping;Dry needling;Passive range of motion    PT Next Visit Plan  review HEP, DN/manual therapy, continue posture exercises, modalities PRN; assess response to DN/manual    PT Home Exercise Plan  Access Code: [email or web address], verbally added W's.    Consulted and Agree with Plan of Care  Patient       Patient will benefit from skilled therapeutic intervention in order to improve the following deficits and impairments:  Increased fascial restricitons, Increased muscle spasms, Pain, Postural dysfunction, Impaired flexibility, Decreased range of motion  Visit Diagnosis: 1. Cervicalgia   2. Other symptoms and signs involving the musculoskeletal system   3. Abnormal posture        Problem List Patient Active Problem List   Diagnosis Date Noted  . DDD (degenerative disc disease), cervical 03/18/2019  . Left lumbar radiculitis 10/23/2017  . History of arthroplasty of both knees 11/02/2015      Clarita CraneStephanie F Reilyn Nelson, PT, DPT 04/07/19 3:26 PM     [facility] [address] [place], [place], [address] Phone: [phone]   Fax:  [phone]  Name: Terry Greene MRN: [id number] Date of Birth: 05/16/1956

## 2019-04-07 NOTE — Patient Instructions (Signed)
Access Code: [email or web address]  URL: [email or web address]  Date: 04/07/2019  Prepared by: Faustino Congress   Exercises  Supine Chin Tuck - 10 reps - 1 sets - 5 sec hold - 2x daily - 7x weekly  Standing Backward Shoulder Rolls - 10 reps - 1 sets - 2x daily - 7x weekly  Seated Scapular Retraction - 10 reps - 1 sets - 5 sec hold - 2x daily - 7x weekly  Seated Upper Trapezius Stretch - 3 reps - 1 sets - 30 sec hold - 2x daily - 7x weekly  Doorway Pec Stretch at 90 Degrees Abduction - 3 reps - 1 sets - 30 sec hold - 1x daily - 7x weekly  Doorway Pec Stretch at 120 Degrees Abduction - 3 reps - 1 sets - 30 sec hold - 1x daily - 7x weekly  Seated Scalene Stretch with Towel - 3 reps - 1 sets - 30 sec hold - 1x daily - 7x weekly  Patient Education  Trigger Point Dry Needling

## 2019-04-10 ENCOUNTER — Ambulatory Visit: Payer: PRIVATE HEALTH INSURANCE | Admitting: Physical Therapy

## 2019-04-10 ENCOUNTER — Other Ambulatory Visit: Payer: Self-pay

## 2019-04-10 ENCOUNTER — Encounter: Payer: Self-pay | Admitting: Physical Therapy

## 2019-04-10 DIAGNOSIS — M542 Cervicalgia: Secondary | ICD-10-CM

## 2019-04-10 DIAGNOSIS — R293 Abnormal posture: Secondary | ICD-10-CM

## 2019-04-10 DIAGNOSIS — R29898 Other symptoms and signs involving the musculoskeletal system: Secondary | ICD-10-CM

## 2019-04-10 NOTE — Therapy (Signed)
[facility] [address] [place], [place], [address] Phone: [phone]   Fax:  [phone]  Physical Therapy Treatment  Patient Details  Name: Terry Greene MRN: [id number] Date of Birth: Sep 30, 1956 Referring Provider (PT): Monica Bectonhekkekandam, Thomas J, MD   Encounter Date: 04/10/2019  PT End of Session - 04/10/19 1607    Visit Number  6    Number of Visits  12    Date for PT Re-Evaluation  05/05/19    PT Start Time  1535    PT Stop Time  1618    PT Time Calculation (min)  43 min    Activity Tolerance  Patient tolerated treatment well;No increased pain    Behavior During Therapy  [facility] for tasks assessed/performed       History reviewed. No pertinent past medical history.  History reviewed. No pertinent surgical history.  There were no vitals filed for this visit.  Subjective Assessment - 04/10/19 1534    Subjective  "okay."  reports aching into Rt shoulder; still feels pain with rotation    Diagnostic tests  x ray: DDD    Patient Stated Goals  improve pain    Currently in Pain?  Yes    Pain Score  3     Pain Location  Neck    Pain Orientation  Right    Pain Descriptors / Indicators  Aching;Tightness    Pain Onset  1 to 4 weeks ago    Pain Frequency  Constant    Aggravating Factors   turning head to right    Pain Relieving Factors  ice                       [facility] Adult PT Treatment/Exercise - 04/10/19 1537      Neck Exercises: Machines for Strengthening   UBE (Upper Arm Bike)  L3 x 4 min (2' each direction)      Modalities   Modalities  Traction      Traction   Type of Traction  Cervical    Min (lbs)  12    Max (lbs)  20    Hold Time  60    Rest Time  20    Time  15      Manual Therapy   Manual Therapy  Soft tissue mobilization;Joint mobilization    Manual therapy comments  pt supine; skilled palpation and monitoring of soft tissue; pt prone and supine    Joint Mobilization  C4-6 Rt to  Lt lateral glides grade 2    Soft tissue mobilization  Rt upper traps with gentle neck PROM all directions       Trigger Point Dry Needling - 04/10/19 1605    Consent Given?  Yes    Education Handout Provided  Previously provided    Muscles Treated Head and Neck  Upper trapezius    Upper Trapezius Response  Twitch reponse elicited;Palpable increased muscle length                PT Long Term Goals - 04/01/19 0816      PT LONG TERM GOAL #1   Title  independent with HEP    Status  On-going    Target Date  05/05/19      PT LONG TERM GOAL #2   Title  report pain < 4/10 with activity for improved function    Status  On-going    Target Date  05/05/19  PT LONG TERM GOAL #3   Title  Rt cervical rotation improved at least 10 degrees without increase in pain for improved function    Status  Achieved      PT LONG TERM GOAL #4   Title  Improve FOTO to </= 24% limitation    Status  On-going    Target Date  05/05/19      PT LONG TERM GOAL #5   Title  n/a            Plan - 04/10/19 1608    Clinical Impression Statement  Pt reports overall improvement in pain, but now seems to have migrated into Rt upper trap.  Making slow progress with PT as pain is improving.    Personal Factors and Comorbidities  Comorbidity 2;Past/Current Experience    Comorbidities  DDD, arthritis, prior hx of DN    Examination-Activity Limitations  Lift;Reach Overhead    Examination-Participation Restrictions  Other   work   Stability/Clinical Decision Making  Stable/Uncomplicated    Rehab Potential  Good    PT Frequency  2x / week    PT Duration  6 weeks    PT Treatment/Interventions  ADLs/Self Care Home Management;Cryotherapy;Electrical Stimulation;Moist Heat;Iontophoresis 4mg /ml Dexamethasone;Traction;Therapeutic exercise;Therapeutic activities;Ultrasound;Patient/family education;Manual techniques;Taping;Dry needling;Passive range of motion    PT Next Visit Plan  review HEP, DN/manual  therapy, continue posture exercises, modalities PRN; assess response to DN/manual    PT Home Exercise Plan  Access Code: [email or web address], verbally added W's.    Consulted and Agree with Plan of Care  Patient       Patient will benefit from skilled therapeutic intervention in order to improve the following deficits and impairments:  Increased fascial restricitons, Increased muscle spasms, Pain, Postural dysfunction, Impaired flexibility, Decreased range of motion  Visit Diagnosis: 1. Cervicalgia   2. Other symptoms and signs involving the musculoskeletal system   3. Abnormal posture        Problem List Patient Active Problem List   Diagnosis Date Noted  . DDD (degenerative disc disease), cervical 03/18/2019  . Left lumbar radiculitis 10/23/2017  . History of arthroplasty of both knees 11/02/2015      Laureen Abrahams, PT, DPT 04/10/19 4:10 PM     [facility] [place], [place], [address] Phone: [phone]   Fax:  [phone]  Name: Terry Greene MRN: [id number] Date of Birth: Feb 28, 1956

## 2019-04-14 ENCOUNTER — Encounter: Payer: Self-pay | Admitting: Physical Therapy

## 2019-04-14 ENCOUNTER — Ambulatory Visit: Payer: PRIVATE HEALTH INSURANCE | Admitting: Physical Therapy

## 2019-04-14 ENCOUNTER — Other Ambulatory Visit: Payer: Self-pay

## 2019-04-14 DIAGNOSIS — M542 Cervicalgia: Secondary | ICD-10-CM

## 2019-04-14 DIAGNOSIS — R293 Abnormal posture: Secondary | ICD-10-CM

## 2019-04-14 DIAGNOSIS — R29898 Other symptoms and signs involving the musculoskeletal system: Secondary | ICD-10-CM | POA: Diagnosis not present

## 2019-04-14 NOTE — Therapy (Signed)
[place]erry [place], [place], [address] Phone: [phone]   Fax:  [phone]  Physical Therapy Treatment  Patient Details  Name: Terry Greene MRN: [id number] Date of Birth: 03-Aug-1956 Referring Provider (PT): Terry Decamp, MD   Encounter Date: 04/14/2019  PT End of Session - 04/14/19 1615    Visit Number  7    Number of Visits  12    Date for PT Re-Evaluation  05/05/19    PT Start Time  9702    PT Stop Time  1623    PT Time Calculation (min)  53 min    Activity Tolerance  Patient tolerated treatment well;No increased pain    Behavior During Therapy  [facility] for tasks assessed/performed       History reviewed. No pertinent past medical history.  History reviewed. No pertinent surgical history.  There were no vitals filed for this visit.  Subjective Assessment - 04/14/19 1536    Subjective  "it feels better than it did."  seems to be isolated to 1 spot.    Diagnostic tests  x ray: DDD    Patient Stated Goals  improve pain    Currently in Pain?  Yes    Pain Score  2     Pain Location  Neck    Pain Orientation  Right    Pain Descriptors / Indicators  Aching;Tightness    Pain Type  Acute pain    Pain Onset  1 to 4 weeks ago    Pain Frequency  Constant    Aggravating Factors   turning head to right    Pain Relieving Factors  ice         [facility] PT Assessment - 04/14/19 1540      Assessment   Medical Diagnosis  M50.30 (ICD-10-CM) - DDD (degenerative disc disease), cervical    Referring Provider (PT)  Terry Decamp, MD    Onset Date/Surgical Date  03/09/19    Hand Dominance  Right    Next MD Visit  04/15/2019      Observation/Other Assessments   Focus on Therapeutic Outcomes (FOTO)   82 (18% limited)      AROM   Cervical Flexion  63    Cervical Extension  44    Cervical - Right Side Bend  32    Cervical - Left Side Bend  35    Cervical - Right Rotation  67    Cervical - Left  Rotation  71                   [facility] Adult PT Treatment/Exercise - 04/14/19 1537      Neck Exercises: Machines for Strengthening   UBE (Upper Arm Bike)  L3 x 4 min (2' each direction)      Neck Exercises: Supine   Neck Retraction  10 reps;5 secs      Traction   Type of Traction  Cervical    Min (lbs)  12    Max (lbs)  20    Hold Time  60    Rest Time  20    Time  15      Manual Therapy   Manual Therapy  Soft tissue mobilization    Soft tissue mobilization  Rt upper traps with gentle neck PROM all directions      Neck Exercises: Stretches   Other Neck Stretches  mid and high position doorway stretch x 30 sec x 3 reps  each position        Trigger Point Dry Needling - 04/14/19 1614    Consent Given?  Yes    Education Handout Provided  Previously provided    Muscles Treated Head and Neck  Upper trapezius    Muscles Treated Upper Quadrant  Supraspinatus    Upper Trapezius Response  Twitch reponse elicited;Palpable increased muscle length    Supraspinatus Response  Twitch response elicited;Palpable increased muscle length                PT Long Term Goals - 04/14/19 1616      PT LONG TERM GOAL #1   Title  independent with HEP    Status  On-going    Target Date  05/05/19      PT LONG TERM GOAL #2   Title  report pain < 4/10 with activity for improved function    Status  Achieved      PT LONG TERM GOAL #3   Title  Rt cervical rotation improved at least 10 degrees without increase in pain for improved function    Status  Achieved      PT LONG TERM GOAL #4   Title  Improve FOTO to </= 24% limitation    Status  Achieved      PT LONG TERM GOAL #5   Title  n/a            Plan - 04/14/19 1616    Clinical Impression Statement  Pt has met all goals except LTG #1 and plan to address next visit.  Overall progressing well with PT and anticipate we may d/c or hold after next visit.    Personal Factors and Comorbidities  Comorbidity 2;Past/Current  Experience    Comorbidities  DDD, arthritis, prior hx of DN    Examination-Activity Limitations  Lift;Reach Overhead    Examination-Participation Restrictions  Other   work   Stability/Clinical Decision Making  Stable/Uncomplicated    Rehab Potential  Good    PT Frequency  2x / week    PT Duration  6 weeks    PT Treatment/Interventions  ADLs/Self Care Home Management;Cryotherapy;Electrical Stimulation;Moist Heat;Iontophoresis 56m/ml Dexamethasone;Traction;Therapeutic exercise;Therapeutic activities;Ultrasound;Patient/family education;Manual techniques;Taping;Dry needling;Passive range of motion    PT Next Visit Plan  review HEP, DN/manual therapy, continue posture exercises, modalities PRN; assess response to traction    PT Home Exercise Plan  Access Code: [email or web address] verbally added W's.    Consulted and Agree with Plan of Care  Patient       Patient will benefit from skilled therapeutic intervention in order to improve the following deficits and impairments:  Increased fascial restricitons, Increased muscle spasms, Pain, Postural dysfunction, Impaired flexibility, Decreased range of motion  Visit Diagnosis: 1. Cervicalgia   2. Other symptoms and signs involving the musculoskeletal system   3. Abnormal posture        Problem List Patient Active Problem List   Diagnosis Date Noted  . DDD (degenerative disc disease), cervical 03/18/2019  . Left lumbar radiculitis 10/23/2017  . History of arthroplasty of both knees 11/02/2015      SLaureen Greene PT, DPT 04/14/19 4:20 PM     [facility] [place] [address]Phone: [phone]  Fax:  [phone] Name: MSaiquan HandsMRN: [id number] of Birth: 4April 27, 1957

## 2019-04-15 ENCOUNTER — Encounter: Payer: Self-pay | Admitting: Sports Medicine

## 2019-04-15 ENCOUNTER — Ambulatory Visit: Payer: PRIVATE HEALTH INSURANCE | Admitting: Sports Medicine

## 2019-04-15 DIAGNOSIS — M5030 Other cervical disc degeneration, unspecified cervical region: Secondary | ICD-10-CM | POA: Diagnosis not present

## 2019-04-15 NOTE — Assessment & Plan Note (Addendum)
Multilevel cervical DDD worse at C6-C7 with radiation into the right trapezius, 70 to 90% better with physical therapy. Continue for now, he has not yet plateaued. Return to see me as needed. Next step would be MRI for interventional planning.

## 2019-04-15 NOTE — Progress Notes (Signed)
  Subjective:    CC: Follow-up  HPI: Terry Greene is a pleasant 63 year old male, we have been treating him for cervical DDD with radiation into the right upper trapezius, he is doing much better, 60 to 90% better after medications, as well as formal physical therapy.  He continues to improve and is happy with how things are going.  I reviewed the past medical history, family history, social history, surgical history, and allergies today and no changes were needed.  Please see the problem list section below in epic for further details.  Past Medical History: No past medical history on file. Past Surgical History: No past surgical history on file. Social History: Social History   Socioeconomic History  . Marital status: Divorced    Spouse name: Not on file  . Number of children: Not on file  . Years of education: Not on file  . Highest education level: Not on file  Occupational History  . Not on file  Social Needs  . Financial resource strain: Not on file  . Food insecurity    Worry: Not on file    Inability: Not on file  . Transportation needs    Medical: Not on file    Non-medical: Not on file  Tobacco Use  . Smoking status: Never Smoker  . Smokeless tobacco: Never Used  Substance and Sexual Activity  . Alcohol use: Not Currently  . Drug use: Never  . Sexual activity: Not on file  Lifestyle  . Physical activity    Days per week: Not on file    Minutes per session: Not on file  . Stress: Not on file  Relationships  . Social Herbalist on phone: Not on file    Gets together: Not on file    Attends religious service: Not on file    Active member of club or organization: Not on file    Attends meetings of clubs or organizations: Not on file    Relationship status: Not on file  Other Topics Concern  . Not on file  Social History Narrative  . Not on file   Family History: No family history on file. Allergies: No Known Allergies Medications: See med rec.   Review of Systems: No fevers, chills, night sweats, weight loss, chest pain, or shortness of breath.   Objective:    General: Well Developed, well nourished, and in no acute distress.  Neuro: Alert and oriented x3, extra-ocular muscles intact, sensation grossly intact.  HEENT: Normocephalic, atraumatic, pupils equal round reactive to light, neck supple, no masses, no lymphadenopathy, thyroid nonpalpable.  Skin: Warm and dry, no rashes. Cardiac: Regular rate and rhythm, no murmurs rubs or gallops, no lower extremity edema.  Respiratory: Clear to auscultation bilaterally. Not using accessory muscles, speaking in full sentences.  Impression and Recommendations:    DDD (degenerative disc disease), cervical Multilevel cervical DDD worse at C6-C7 with radiation into the right trapezius, 70 to 90% better with physical therapy. Continue for now, he has not yet plateaued. Return to see me as needed. Next step would be MRI for interventional planning.   ___________________________________________ Gwen Her. [name], M.D., ABFM., CAQSM. Primary Care and Sports Medicine [facility]  Adjunct Professor of [place] [employer]

## 2019-04-16 ENCOUNTER — Other Ambulatory Visit: Payer: Self-pay

## 2019-04-16 ENCOUNTER — Ambulatory Visit: Payer: PRIVATE HEALTH INSURANCE | Admitting: Physical Therapy

## 2019-04-16 DIAGNOSIS — R29898 Other symptoms and signs involving the musculoskeletal system: Secondary | ICD-10-CM

## 2019-04-16 DIAGNOSIS — M542 Cervicalgia: Secondary | ICD-10-CM | POA: Diagnosis not present

## 2019-04-16 DIAGNOSIS — R293 Abnormal posture: Secondary | ICD-10-CM | POA: Diagnosis not present

## 2019-04-16 NOTE — Therapy (Signed)
[facility] [address] [place], [place], [address] Phone: [phone]   Fax:  [phone]  Physical Therapy Treatment  Patient Details  Name: Terry Greene MRN: [id number] Date of Birth: December 22, 1955 Referring Provider (PT): Monica Bectonhekkekandam, Thomas J, MD   Encounter Date: 04/16/2019  PT End of Session - 04/16/19 1651    Visit Number  8    Number of Visits  12    Date for PT Re-Evaluation  05/05/19    PT Start Time  1602    PT Stop Time  1655    PT Time Calculation (min)  53 min    Activity Tolerance  Patient tolerated treatment well;No increased pain    Behavior During Therapy  [facility] for tasks assessed/performed       No past medical history on file.  No past surgical history on file.  There were no vitals filed for this visit.  Subjective Assessment - 04/16/19 1605    Subjective  Pt reports he had relief with last session, but he woke up this morning with increased pain.    Patient Stated Goals  improve pain    Currently in Pain?  Yes    Pain Score  4     Pain Location  Neck    Pain Orientation  Right    Pain Descriptors / Indicators  Aching;Sharp    Aggravating Factors   turning head to right    Pain Relieving Factors  ice         [facility] PT Assessment - 04/16/19 0001      Assessment   Medical Diagnosis  M50.30 (ICD-10-CM) - DDD (degenerative disc disease), cervical    Referring Provider (PT)  Monica Bectonhekkekandam, Thomas J, MD    Onset Date/Surgical Date  03/09/19    Hand Dominance  Right    Next MD Visit  04/15/2019                   [facility] 04/16/19 0001      Neck Exercises: Machines for Strengthening   UBE (Upper Arm Bike)  L3 x 4 min (2' each direction)      Neck Exercises: Seated   Cervical Rotation  Right;Left;5 reps    Shoulder Rolls  Backwards;10 reps    Other Seated Exercise  scap retraction with axial ext x 5 sec x 10 reps      Modalities   Modalities   Cryotherapy;Traction      Cryotherapy   Number Minutes Cryotherapy  15 Minutes   during traction   Cryotherapy Location  --   upper thoracic/ lower cervical   Type of Cryotherapy  Ice pack      Electrical Stimulation   Electrical Stimulation Location  Rt cervical paraspinals, upper trap, scalenes    Electrical Stimulation Action  combo US    Electrical Stimulation Parameters  to tolerance    Electrical Stimulation Goals  Tone;Pain      Ultrasound   Ultrasound Location  see estim    Ultrasound Parameters  combo US: 100%, 1.2 w/cm2, 8 min       Traction   Type of Traction  Cervical    Min (lbs)  12    Max (lbs)  20    Hold Time  60    Rest Time  20    Time  15      Manual Therapy   Soft tissue mobilization  IASTM to bil suboccipitals, cervical/upper thoracic paraspinals  and upper traps, scalenes      Neck Exercises: Stretches   Upper Trapezius Stretch  Right;3 reps;20 seconds    Other Neck Stretches  mid and high position doorway stretch x 30 sec x 3 reps each position                   PT Long Term Goals - 04/14/19 1616      PT LONG TERM GOAL #1   Title  independent with HEP    Status  On-going    Target Date  05/05/19      PT LONG TERM GOAL #2   Title  report pain < 4/10 with activity for improved function    Status  Achieved      PT LONG TERM GOAL #3   Title  Rt cervical rotation improved at least 10 degrees without increase in pain for improved function    Status  Achieved      PT LONG TERM GOAL #4   Title  Improve FOTO to </= 24% limitation    Status  Achieved      PT LONG TERM GOAL #5   Title  n/a            Plan - 04/16/19 1649    Clinical Impression Statement  Pt had flare up of symptoms today; initially unable to turn head right without pain.  Pt reported eliminiation of pain and improved ability to turn head Rt at end of session.  Pt may benefit from a few more sessions to assist with continued improvement.    Personal Factors and  Comorbidities  Comorbidity 2;Past/Current Experience    Comorbidities  DDD, arthritis, prior hx of DN    Examination-Activity Limitations  Lift;Reach Overhead    Examination-Participation Restrictions  Other   work   Stability/Clinical Decision Making  Stable/Uncomplicated    Rehab Potential  Good    PT Frequency  2x / week    PT Duration  6 weeks    PT Treatment/Interventions  ADLs/Self Care Home Management;Cryotherapy;Electrical Stimulation;Moist Heat;Iontophoresis 4mg /ml Dexamethasone;Traction;Therapeutic exercise;Therapeutic activities;Ultrasound;Patient/family education;Manual techniques;Taping;Dry needling;Passive range of motion    PT Next Visit Plan  review HEP, DN/manual therapy, continue posture exercises, modalities PRN; assess response to traction    PT Home Exercise Plan  Access Code: [email or web address], verbally added W's.    Consulted and Agree with Plan of Care  Patient       Patient will benefit from skilled therapeutic intervention in order to improve the following deficits and impairments:  Increased fascial restricitons, Increased muscle spasms, Pain, Postural dysfunction, Impaired flexibility, Decreased range of motion  Visit Diagnosis: 1. Cervicalgia   2. Other symptoms and signs involving the musculoskeletal system   3. Abnormal posture        Problem List Patient Active Problem List   Diagnosis Date Noted  . DDD (degenerative disc disease), cervical 03/18/2019  . Left lumbar radiculitis 10/23/2017  . History of arthroplasty of both knees 11/02/2015   Terry Greene, Terry Greene 04/16/19 5:00 PM  Terry Greene [place], [place], [address] Phone: [phone]   Fax:  [phone]  Name: Terry Greene MRN: [id number] Date of Birth: 09-12-1956

## 2019-04-23 ENCOUNTER — Encounter: Payer: Self-pay | Admitting: Physical Therapy

## 2019-04-23 ENCOUNTER — Other Ambulatory Visit: Payer: Self-pay

## 2019-04-23 ENCOUNTER — Ambulatory Visit: Payer: PRIVATE HEALTH INSURANCE | Admitting: Physical Therapy

## 2019-04-23 DIAGNOSIS — M542 Cervicalgia: Secondary | ICD-10-CM

## 2019-04-23 DIAGNOSIS — R293 Abnormal posture: Secondary | ICD-10-CM

## 2019-04-23 DIAGNOSIS — R29898 Other symptoms and signs involving the musculoskeletal system: Secondary | ICD-10-CM | POA: Diagnosis not present

## 2019-04-23 NOTE — Therapy (Signed)
[facility] [place] [address] [place], [place], [address] Phone: [phone]   Fax:  [phone]  Physical Therapy Treatment  Patient Details  Name: Terry Greene MRN: [id number] Date of Birth: September 09, 1956 Referring Provider (PT): Monica Bectonhekkekandam, Thomas J, MD   Encounter Date: 04/23/2019  PT End of Session - 04/23/19 1551    Visit Number  9    Number of Visits  12    Date for PT Re-Evaluation  05/05/19    PT Start Time  1505    PT Stop Time  1557    PT Time Calculation (min)  52 min    Activity Tolerance  Patient tolerated treatment well    Behavior During Therapy  [facility] for tasks assessed/performed       History reviewed. No pertinent past medical history.  History reviewed. No pertinent surgical history.  There were no vitals filed for this visit.  Subjective Assessment - 04/23/19 1520    Subjective  Pt reports he has a low pain level in morning, but as the day goes on his Rt neck pain increases to 3/10.  He tried IASMT at home at lunch and it helped reduced to 1/10.  But returning to work the pain returned to 3/10.    Currently in Pain?  Yes    Pain Score  3     Pain Location  Neck    Pain Orientation  Right    Pain Descriptors / Indicators  Aching;Dull    Aggravating Factors   turning head to right    Pain Relieving Factors  ice, stretches.         [facility] - 04/23/19 0001      Assessment   Medical Diagnosis  M50.30 (ICD-10-CM) - DDD (degenerative disc disease), cervical    Referring Provider (PT)  Monica Bectonhekkekandam, Thomas J, MD    Onset Date/Surgical Date  03/09/19    Hand Dominance  Right    Next MD Visit  PRN        [facility]/Ex[facility] - 04/23/19 0001      Neck Exercises: Machines for Strengthening   UBE (Upper Arm Bike)  L3 x 2 min (1' each direction)      Neck Exercises: Standing   Other Standing Exercises  reverse wall push ups x 10; bilat shoulder ext and row with red band x 10  reps each.     Other Standing Exercises  axial ext x 3 sec x 10 reps with VC and visual feedback from mirror.  W's x 10 sec x 5 reps;  wall angels x 5 reps      Cryotherapy   Number Minutes Cryotherapy  15 Minutes   during traction   Cryotherapy Location  --   upper thoracic/ lower cervical   Type of Cryotherapy  Ice pack      Traction   Type of Traction  Cervical    Min (lbs)  12    Max (lbs)  20    Hold Time  60    Rest Time  20    Time  15      Manual Therapy   Soft tissue mobilization  IASTM to bil suboccipitals, cervical/upper thoracic paraspinals and upper traps, scalenes      Neck Exercises: Stretches   Upper Trapezius Stretch  Right;3 reps;20 seconds    Other Neck Stretches  3 position doorway stretch x 30 sec x 2 reps each position  PT Education - 04/23/19 1552    Education Details  HEP    Person(s) Educated  Patient    Methods  Explanation;Demonstration;Verbal cues;Handout    Comprehension  Verbalized understanding;Returned demonstration;Verbal cues required          PT Long Term Goals - 04/14/19 1616      PT LONG TERM GOAL #1   Title  independent with HEP    Status  On-going    Target Date  05/05/19      PT LONG TERM GOAL #2   Title  report pain < 4/10 with activity for improved function    Status  Achieved      PT LONG TERM GOAL #3   Title  Rt cervical rotation improved at least 10 degrees without increase in pain for improved function    Status  Achieved      PT LONG TERM GOAL #4   Title  Improve FOTO to </= 24% limitation    Status  Achieved      PT LONG TERM GOAL #5   Title  n/a            Plan - 04/23/19 1549    Clinical Impression Statement  Pt reporting increase in Rt neck symptoms as the day of work progresses.  Positive response to IASTM; shown this technique to use at home with partner.  pt reported elimination of pain in neck with IASTM at clinic. He tolerated new strengthening exercises well.   Pt is near  meeting remaining goals.       Patient will benefit from skilled therapeutic intervention in order to improve the following deficits and impairments:     Visit Diagnosis: 1. Cervicalgia   2. Other symptoms and signs involving the musculoskeletal system   3. Abnormal posture        Problem List Patient Active Problem List   Diagnosis Date Noted  . DDD (degenerative disc disease), cervical 03/18/2019  . Left lumbar radiculitis 10/23/2017  . History of arthroplasty of both knees 11/02/2015   [name], PTA 04/23/19 3:53 PM  [place], [place], [address] Phone: [phone]   Fax:  [phone]  Name: Terry Greene MRN: [id number] Date of Birth: 1955/11/02

## 2019-04-23 NOTE — Patient Instructions (Signed)
Axial Extension (Chin Tuck)    Pull chin in and lengthen back of neck. Hold __5__ seconds while counting out loud. Repeat __10__ times. Do __several__ sessions per day.  Scapular Retraction: Elbow Flexion (Standing)  "W's"     With elbows bent to 90, pinch shoulder blades together and rotate arms out, keeping elbows bent. Repeat __10__ times per set. Do __1-2__ sets per session. Do _several ___ sessions per day.  Resistive Band Rowing   With resistive band anchored in door, grasp both ends. Keeping elbows bent, pull back, squeezing shoulder blades together. Hold _3-5___ seconds. Repeat _10-30___ times. Do __1__ sessions per day.   Strengthening: Resisted Extension   Hold tubing with both hands, arms forward. Pull arms back, elbow straight. Repeat _10-30___ times per set. Do _1___ sets per session. Do _1___ sessions per day.   [facility] [place], [place] [address]  [phone] (office) [phone] (fax)

## 2019-04-25 ENCOUNTER — Ambulatory Visit: Payer: PRIVATE HEALTH INSURANCE | Admitting: Rehabilitative and Restorative Service Providers"

## 2019-04-25 ENCOUNTER — Other Ambulatory Visit: Payer: Self-pay

## 2019-04-25 ENCOUNTER — Encounter: Payer: Self-pay | Admitting: Rehabilitative and Restorative Service Providers"

## 2019-04-25 DIAGNOSIS — R293 Abnormal posture: Secondary | ICD-10-CM | POA: Diagnosis not present

## 2019-04-25 DIAGNOSIS — M25662 Stiffness of left knee, not elsewhere classified: Secondary | ICD-10-CM | POA: Diagnosis not present

## 2019-04-25 DIAGNOSIS — M542 Cervicalgia: Secondary | ICD-10-CM | POA: Diagnosis not present

## 2019-04-25 DIAGNOSIS — R29898 Other symptoms and signs involving the musculoskeletal system: Secondary | ICD-10-CM

## 2019-04-25 NOTE — Therapy (Signed)
[facility] [address] [place], [place], [address] Phone: [phone]   Fax:  [phone]  Physical Therapy Treatment  Patient Details  Name: Terry Greene MRN: [id number] Date of Birth: Mar 06, 1956 Referring Provider (PT): Monica Bectonhekkekandam, Thomas J, MD   Encounter Date: 04/25/2019  PT End of Session - 04/25/19 1535    Visit Number  10    Number of Visits  12    Date for PT Re-Evaluation  05/05/19    PT Start Time  1528    PT Stop Time  1614    PT Time Calculation (min)  46 min    Activity Tolerance  Patient tolerated treatment well       History reviewed. No pertinent past medical history.  History reviewed. No pertinent surgical history.  There were no vitals filed for this visit.  Subjective Assessment - 04/25/19 1537    Subjective  Continues to improve. Having less pain and improved ability to move his head. Traction has helped with stretching neck and relieving pain.    Currently in Pain?  Yes    Pain Score  2     Pain Location  Neck    Pain Orientation  Right    Pain Descriptors / Indicators  Aching;Dull    Pain Type  Acute pain    Pain Radiating Towards  Rt upper trap    Pain Onset  More than a month ago    Pain Frequency  Intermittent    Aggravating Factors   turning head to the Rt    Pain Relieving Factors  ice; stretching; traction                       [facility] Adult PT Treatment/Exercise - 04/25/19 0001      Neuro Re-ed    Neuro Re-ed Details   education re- posture and alignment chest up, engaging posterior shoulder girdle       Neck Exercises: Machines for Strengthening   UBE (Upper Arm Bike)  L3 x 4 min (2' each direction)      Neck Exercises: Standing   Other Standing Exercises  row x 10; shd extensin x 10 red TB cues to engage posterior shoudler girdle     Other Standing Exercises  axial ext x 5 sec x 10 reps; scap squeeze 10 sec x 10 reps VC and tactile cues; scapular  retraction with yellow TB 10 reps x 2 sets cues for engaging posterior shoulder girdle       Neck Exercises: Supine   Neck Retraction  10 secs;5 reps      Manual Therapy   Manual therapy comments  pt supine hooklying     Joint Mobilization  CPA and lateral glides cerviical to T1/2     Soft tissue mobilization  deep tissue work through the ant/lat/posterior cervical spine into the pecs and upper traps; deep tissue work into the scaleni Rt > Lt     Myofascial Release  anterior cervical to pecs     Passive ROM  cervical flexion; flexion with slight rotation; scaleni stretches     Manual Traction  cervical traction through the treatment 20-30 sec hold several reps; traction through long arm Rt UE to stretch structures through upper trap/shd       Neck Exercises: Stretches   Other Neck Stretches  3 position doorway stretch x 30 sec x 2 reps each position  PT Long Term Goals - 04/14/19 1616      PT LONG TERM GOAL #1   Title  independent with HEP    Status  On-going    Target Date  05/05/19      PT LONG TERM GOAL #2   Title  report pain < 4/10 with activity for improved function    Status  Achieved      PT LONG TERM GOAL #3   Title  Rt cervical rotation improved at least 10 degrees without increase in pain for improved function    Status  Achieved      PT LONG TERM GOAL #4   Title  Improve FOTO to </= 24% limitation    Status  Achieved      PT LONG TERM GOAL #5   Title  n/a            Plan - 04/25/19 1536    Clinical Impression Statement  Continued improvement with positive response to manual and mechanical traction; postural corretion' stretching. Needs continue work to release tissue tightness in deep cervical musculature which is accomplished with deep tissue work and traction with progress maintained with manual work, postural correction including posterior shoulder girdle strengthening. Progressing well toward stated goals of therapy.     Personal Factors and Comorbidities  Comorbidity 2;Past/Current Experience    Comorbidities  DDD, arthritis, prior hx of DN    Examination-Activity Limitations  Lift;Reach Overhead    Examination-Participation Restrictions  Other   work   Stability/Clinical Decision Making  Stable/Uncomplicated    Rehab Potential  Good    PT Frequency  2x / week    PT Duration  6 weeks    PT Treatment/Interventions  ADLs/Self Care Home Management;Cryotherapy;Electrical Stimulation;Moist Heat;Iontophoresis 4mg /ml Dexamethasone;Traction;Therapeutic exercise;Therapeutic activities;Ultrasound;Patient/family education;Manual techniques;Taping;Dry needling;Passive range of motion    PT Next Visit Plan  review HEP, DN/manual therapy, continue posture exercises, modalities PRN; continue traction - manual and mechanical    PT Home Exercise Plan  Access Code: [email or web address], verbally added W's.    Consulted and Agree with Plan of Care  Patient       Patient will benefit from skilled therapeutic intervention in order to improve the following deficits and impairments:  Increased fascial restricitons, Increased muscle spasms, Pain, Postural dysfunction, Impaired flexibility, Decreased range of motion  Visit Diagnosis: 1. Cervicalgia   2. Other symptoms and signs involving the musculoskeletal system   3. Abnormal posture   4. Stiffness of left knee        Problem List Patient Active Problem List   Diagnosis Date Noted  . DDD (degenerative disc disease), cervical 03/18/2019  . Left lumbar radiculitis 10/23/2017  . History of arthroplasty of both knees 11/02/2015    Chrystine Frogge Nilda Simmer PT, MPH  04/25/2019, 4:29 PM  [facility] [place], [place], [address] Phone: [phone]   Fax:  [phone]  Name: Terry Greene MRN: [id number] Date of Birth: August 16, 1956

## 2019-04-28 ENCOUNTER — Other Ambulatory Visit: Payer: Self-pay

## 2019-04-28 ENCOUNTER — Encounter: Payer: Self-pay | Admitting: Physical Therapy

## 2019-04-28 ENCOUNTER — Ambulatory Visit (INDEPENDENT_AMBULATORY_CARE_PROVIDER_SITE_OTHER): Payer: PRIVATE HEALTH INSURANCE | Admitting: Physical Therapy

## 2019-04-28 DIAGNOSIS — R293 Abnormal posture: Secondary | ICD-10-CM | POA: Diagnosis not present

## 2019-04-28 DIAGNOSIS — R29898 Other symptoms and signs involving the musculoskeletal system: Secondary | ICD-10-CM | POA: Diagnosis not present

## 2019-04-28 DIAGNOSIS — M542 Cervicalgia: Secondary | ICD-10-CM

## 2019-04-28 NOTE — Therapy (Signed)
[place], [place], [address] Phone: [phone]   Fax:  [phone]  Physical Therapy Treatment  Patient Details  Name: Terry Greene MRN: [id number] Date of Birth: 1956-06-15 Referring Provider (PT): Silverio Decamp, MD   Encounter Date: 04/28/2019  PT End of Session - 04/28/19 1508    Visit Number  11    Number of Visits  12    Date for PT Re-Evaluation  05/05/19    PT Start Time  1501    PT Stop Time  0630    PT Time Calculation (min)  40 min    Activity Tolerance  Patient tolerated treatment well;No increased pain    Behavior During Therapy  [facility] for tasks assessed/performed       History reviewed. No pertinent past medical history.  History reviewed. No pertinent surgical history.  There were no vitals filed for this visit.  Subjective Assessment - 04/28/19 1505    Subjective  Pt reports he was sore the day after last visit.  But today, by now his neck would normally be hurting "pretty good", but it is only barely there.    Patient Stated Goals  improve pain    Currently in Pain?  Yes    Pain Score  1     Pain Location  Neck    Pain Orientation  Right    Pain Descriptors / Indicators  Dull    Aggravating Factors   looking down for long periods    Pain Relieving Factors  ice, stretching, massage         [facility] PT Assessment - 04/28/19 0001      Assessment   Medical Diagnosis  M50.30 (ICD-10-CM) - DDD (degenerative disc disease), cervical    Referring Provider (PT)  Silverio Decamp, MD    Onset Date/Surgical Date  03/09/19    Hand Dominance  Right    Next MD Visit  PRN       [facility] Adult PT Treatment/Exercise - 04/28/19 0001      Neck Exercises: Machines for Strengthening   UBE (Upper Arm Bike)  L3 x 4 min (2' each direction)      Neck Exercises: Standing   Other Standing Exercises  row x 10; shd extensin x 10 red TB cues to engage posterior shoudler girdle     Other Standing Exercises  scapular retraction with yellow TB 10 reps x 2 sets cues for engaging posterior shoulder girdle       Neck Exercises: Seated   Cervical Rotation  Right;Left;5 reps   with chin tuck   Lateral Flexion  Right;Left;5 reps   with chin tuck     Neck Exercises: Supine   Neck Retraction  10 secs;5 reps    Other Supine Exercise  prolonged stretch with bilat arms in horiz abdct x 1 min       Manual Therapy   Manual therapy comments  pt supine hooklying     Soft tissue mobilization  deep tissue work through the ant/lat/posterior cervical spine into the pecs and upper traps; deep tissue work into the scaleni Rt > Lt     Myofascial Release  anterior cervical to pecs     Passive ROM  cervical flexion; flexion with slight rotation; scaleni stretches     Manual Traction  cervical traction through the treatment 20-30 sec hold several reps      Neck Exercises: Stretches   Other Neck Stretches  3 position  doorway stretch x 30 sec x 2 reps each position                   PT Long Term Goals - 04/14/19 1616      PT LONG TERM GOAL #1   Title  independent with HEP    Status  On-going    Target Date  05/05/19      PT LONG TERM GOAL #2   Title  report pain < 4/10 with activity for improved function    Status  Achieved      PT LONG TERM GOAL #3   Title  Rt cervical rotation improved at least 10 degrees without increase in pain for improved function    Status  Achieved      PT LONG TERM GOAL #4   Title  Improve FOTO to </= 24% limitation    Status  Achieved      PT LONG TERM GOAL #5   Title  n/a            Plan - 04/28/19 1707    Clinical Impression Statement  Continued positive response with manual therapy/ manual traction to neck along with postural corrective exercises.  Pt reporting less episodes of pain and increasesd awareness of body position and it's affect on pain and function.  Progressing well towards remaining goal    Personal Factors and  Comorbidities  Comorbidity 2;Past/Current Experience    Comorbidities  DDD, arthritis, prior hx of DN    Examination-Activity Limitations  Lift;Reach Overhead    Examination-Participation Restrictions  Other   work   Stability/Clinical Decision Making  Stable/Uncomplicated    Rehab Potential  Good    PT Frequency  2x / week    PT Duration  6 weeks    PT Treatment/Interventions  ADLs/Self Care Home Management;Cryotherapy;Electrical Stimulation;Moist Heat;Iontophoresis 4mg /ml Dexamethasone;Traction;Therapeutic exercise;Therapeutic activities;Ultrasound;Patient/family education;Manual techniques;Taping;Dry needling;Passive range of motion    PT Next Visit Plan  end of POC; assess readiness to d/c to HEP.    PT Home Exercise Plan  Access Code: [email or web address], verbally added W's.    Consulted and Agree with Plan of Care  Patient       Patient will benefit from skilled therapeutic intervention in order to improve the following deficits and impairments:  Increased fascial restricitons, Increased muscle spasms, Pain, Postural dysfunction, Impaired flexibility, Decreased range of motion  Visit Diagnosis: 1. Cervicalgia   2. Abnormal posture   3. Other symptoms and signs involving the musculoskeletal system        Problem List Patient Active Problem List   Diagnosis Date Noted  . DDD (degenerative disc disease), cervical 03/18/2019  . Left lumbar radiculitis 10/23/2017  . History of arthroplasty of both knees 11/02/2015   Mayer CamelJennifer Carlson-Long, PTA 04/28/19 5:12 PM  [facility] [address] [place], [place], [address] Phone: [phone]   Fax:  [phone]  Name: Terry Greene MRN: [id number] Date of Birth: 1956/07/08

## 2019-05-01 ENCOUNTER — Ambulatory Visit: Payer: PRIVATE HEALTH INSURANCE | Admitting: Rehabilitative and Restorative Service Providers"

## 2019-05-01 ENCOUNTER — Encounter: Payer: Self-pay | Admitting: Rehabilitative and Restorative Service Providers"

## 2019-05-01 ENCOUNTER — Other Ambulatory Visit: Payer: Self-pay

## 2019-05-01 DIAGNOSIS — R293 Abnormal posture: Secondary | ICD-10-CM

## 2019-05-01 DIAGNOSIS — R29898 Other symptoms and signs involving the musculoskeletal system: Secondary | ICD-10-CM | POA: Diagnosis not present

## 2019-05-01 DIAGNOSIS — M542 Cervicalgia: Secondary | ICD-10-CM

## 2019-05-01 NOTE — Therapy (Addendum)
[facility] [address] [address] [place], [place], [address] Phone: [phone]   Fax:  [phone]  Physical Therapy Treatment  Patient Details  Name: Terry CellaMichael Greene MRN: [id number] Date of Birth: [birth year] Referring Provider (PT): Monica Bectonhekkekandam, Thomas J, MD   Encounter Date: 05/01/2019  PT End of Session - 05/01/19 1512    Visit Number  12    Number of Visits  22    Date for PT Re-Evaluation  06/12/19    PT Start Time  1508    PT Stop Time  1554    PT Time Calculation (min)  46 min       History reviewed. No pertinent past medical history.  History reviewed. No pertinent surgical history.  There were no vitals filed for this visit.      [facility] - 05/01/19 0001      Assessment   Medical Diagnosis  M50.30 (ICD-10-CM) - DDD (degenerative disc disease), cervical    Referring Provider (PT)  Monica Bectonhekkekandam, Thomas J, MD    Onset Date/Surgical Date  03/09/19    Hand Dominance  Right    Next MD Visit  PRN      Posture/Postural Control   Posture Comments  improving cervical and thoracic posture and alignment       AROM   Cervical Flexion  60    Cervical Extension  45    Cervical - Right Side Bend  32    Cervical - Left Side Bend  33    Cervical - Right Rotation  68    Cervical - Left Rotation  70      Palpation   Palpation comment  muscular tightness ant/lat/posterior cervical musculature; upper trap/levator scapula Rt as well as into Rt first rib area                    [facility] Adult PT Treatment/Exercise - 05/01/19 0001      Neck Exercises: Machines for Strengthening   UBE (Upper Arm Bike)  L3 x 4 min (2' each direction)      Manual Therapy   Manual therapy comments  pt supine hooklying     Joint Mobilization  working through the 1st rib and clavicle Rt     Soft tissue mobilization  deep tissue work through the ant/lat/posterior cervical spine into the pecs and upper traps; deep tissue  work into the scaleni Rt > Lt     Myofascial Release  anterior cervical to pecs     Passive ROM  cervical flexion; flexion with slight rotation; scaleni stretches     Manual Traction  cervical traction through the treatment 20-30 sec hold several reps      Neck Exercises: Stretches   Other Neck Stretches  3 position doorway stretch x 30 sec x 2 reps each position                   PT Long Term Goals - 05/01/19 1515      PT LONG TERM GOAL #1   Time  18    Period  Weeks    Status  New    Target Date  06/12/19      PT LONG TERM GOAL #2   Title  report pain < 4/10 with activity for improved function    Time  12    Period  Weeks    Status  Achieved      PT LONG TERM GOAL #3  Title  Rt cervical rotation improved at least 10 degrees without increase in pain for improved function    Time  12    Period  Weeks    Status  Achieved      PT LONG TERM GOAL #4   Title  Improve FOTO to </= 24% limitation    Time  12    Period  Weeks    Status  Achieved            Plan - 05/01/19 1515    Clinical Impression Statement  Continued improvement. Having much less pain in the Rt neck area. Patient demonstrates increased cervical ROM and thoracic mobility. He has improved tissue extensibility but persistent tightness in the Rt deeper cervical musculature. Will benefit from continued deep tissue work to release remaining tightness and achieve full rehab potential.    Personal Factors and Comorbidities  Comorbidity 2;Past/Current Experience    Comorbidities  DDD, arthritis, prior hx of DN    Examination-Activity Limitations  Lift;Reach Overhead    Examination-Participation Restrictions  Other   work   Stability/Clinical Decision Making  Stable/Uncomplicated    Rehab Potential  Good    PT Frequency  2x / week    PT Duration  6 weeks    PT Treatment/Interventions  ADLs/Self Care Home Management;Cryotherapy;Electrical Stimulation;Moist Heat;Iontophoresis 4mg /ml  Dexamethasone;Traction;Therapeutic exercise;Therapeutic activities;Ultrasound;Patient/family education;Manual techniques;Taping;Dry needling;Passive range of motion    PT Next Visit Plan  continue one x/wk focus on deep tissue work    PT Home Exercise Plan  Access Code: [email or web address], verbally added W's.    Consulted and Agree with Plan of Care  Patient       Patient will benefit from skilled therapeutic intervention in order to improve the following deficits and impairments:  Increased fascial restricitons, Increased muscle spasms, Pain, Postural dysfunction, Impaired flexibility, Decreased range of motion  Visit Diagnosis: 1. Cervicalgia   2. Abnormal posture   3. Other symptoms and signs involving the musculoskeletal system        Problem List Patient Active Problem List   Diagnosis Date Noted  . DDD (degenerative disc disease), cervical 03/18/2019  . Left lumbar radiculitis 10/23/2017  . History of arthroplasty of both knees 11/02/2015    Terry Greene Terry Greene PT, MPH  05/01/2019, 3:55 PM  [facility] [place], [place], [address] Phone: [phone]   Fax:  [phone]  Name: Terry Greene MRN: [id number] Date of Birth: [birth year]

## 2019-05-08 ENCOUNTER — Other Ambulatory Visit: Payer: Self-pay

## 2019-05-08 ENCOUNTER — Encounter: Payer: Self-pay | Admitting: Rehabilitative and Restorative Service Providers"

## 2019-05-08 ENCOUNTER — Ambulatory Visit (INDEPENDENT_AMBULATORY_CARE_PROVIDER_SITE_OTHER): Payer: PRIVATE HEALTH INSURANCE | Admitting: Rehabilitative and Restorative Service Providers"

## 2019-05-08 DIAGNOSIS — R293 Abnormal posture: Secondary | ICD-10-CM

## 2019-05-08 DIAGNOSIS — M542 Cervicalgia: Secondary | ICD-10-CM | POA: Diagnosis not present

## 2019-05-08 DIAGNOSIS — R29898 Other symptoms and signs involving the musculoskeletal system: Secondary | ICD-10-CM | POA: Diagnosis not present

## 2019-05-08 NOTE — Therapy (Signed)
[facility] [address] [place], [place], [address] Phone: [phone]   Fax:  [phone]  Physical Therapy Treatment  Patient Details  Name: Terry Greene MRN: [id number] Date of Birth: Feb 16, 1956 Referring Provider (PT): Monica Bectonhekkekandam, Thomas J, MD   Encounter Date: 05/08/2019  PT End of Session - 05/08/19 1405    Visit Number  13    Number of Visits  22    Date for PT Re-Evaluation  06/12/19    PT Start Time  1402    PT Stop Time  1449    PT Time Calculation (min)  47 min    Activity Tolerance  Patient tolerated treatment well       History reviewed. No pertinent past medical history.  History reviewed. No pertinent surgical history.  There were no vitals filed for this visit.  Subjective Assessment - 05/08/19 1405    Subjective  Pt reports tht he was doing very well until this morning about 30 min after getting up had grabbing pain in the Rt neck and shoudler area. Did fall asleep in his reclining sofa last night and awoke with stiffness. Felt better as he went to bed and he slept well and awoke with no pain. Noticed stiffness and pain about 30 in after gettting up and it has continued throughout the day.    Currently in Pain?  Yes    Pain Score  5     Pain Location  Neck    Pain Orientation  Right    Pain Descriptors / Indicators  Aching;Dull    Pain Type  Acute pain    Pain Onset  More than a month ago    Pain Frequency  Intermittent         [facility] PT Assessment - 05/08/19 0001      Assessment   Medical Diagnosis  M50.30 (ICD-10-CM) - DDD (degenerative disc disease), cervical    Referring Provider (PT)  Monica Bectonhekkekandam, Thomas J, MD    Onset Date/Surgical Date  03/09/19    Hand Dominance  Right    Next MD Visit  PRN      AROM   AROM Assessment Site  --   discomfort with all cervical motions pain w/ lateral flexion   Cervical Flexion  47    Cervical Extension  40    Cervical - Right Side Bend  31    Cervical - Left Side Bend  18    Cervical - Right Rotation  61    Cervical - Left Rotation  64      Palpation   Palpation comment  muscular tightness ant/lat/posterior cervical musculature; upper trap/levator scapula Rt as well as into Rt first rib area                    [facility] Adult PT Treatment/Exercise - 05/08/19 0001      Neck Exercises: Seated   Neck Retraction  5 reps;5 secs    Cervical Rotation  Right;Left;5 reps   w/ chin tuck   Lateral Flexion  Right;Left;5 reps   w/ chin tuck      Neck Exercises: Supine   Neck Retraction  10 secs;5 reps      Manual Therapy   Manual therapy comments  pt supine hooklying     Joint Mobilization  working through the 1st rib and clavicle Rt     Soft tissue mobilization  deep tissue work through the ant/lat/posterior cervical spine into the pecs and  upper traps; deep tissue work into the scaleni Rt > Lt     Myofascial Release  anterior cervical to pecs     Passive ROM  lateral cervical flexion     Manual Traction  cervical traction through the treatment 20-30 sec hold several reps      Neck Exercises: Stretches   Other Neck Stretches  3 position doorway stretch x 30 sec x 2 reps each position        Trigger Point Dry Needling - 05/08/19 0001    Consent Given?  Yes    Education Handout Provided  Previously provided    Upper Trapezius Response  Palpable increased muscle length    Scalenes Response  Palpable increased muscle length                PT Long Term Goals - 05/01/19 1515      PT LONG TERM GOAL #1   Time  18    Period  Weeks    Status  New    Target Date  06/12/19      PT LONG TERM GOAL #2   Title  report pain < 4/10 with activity for improved function    Time  12    Period  Weeks    Status  Achieved      PT LONG TERM GOAL #3   Title  Rt cervical rotation improved at least 10 degrees without increase in pain for improved function    Time  12    Period  Weeks    Status  Achieved      PT LONG  TERM GOAL #4   Title  Improve FOTO to </= 24% limitation    Time  12    Period  Weeks    Status  Achieved            Plan - 05/08/19 1405    Clinical Impression Statement  Significant flare up of pain and tightness with limited cervical mobility/ROM since this morning. Unclear etiology. Patient responded well to manual work and DN with increased cervical mobility and decreased pain with treatment. Discussed possible causes for flare up - patient will assess and change afternoon nap position getting out of reclining sofa; assess sleeping positions; continue with TENs unit and heast/ice at home. Will benefit from continued treatment to restore prior level of function from before flare up.    Personal Factors and Comorbidities  Comorbidity 2;Past/Current Experience    Comorbidities  DDD, arthritis, prior hx of DN    Examination-Activity Limitations  Lift;Reach Overhead    Examination-Participation Restrictions  Other   work   Stability/Clinical Decision Making  Stable/Uncomplicated    Rehab Potential  Good    PT Frequency  2x / week    PT Duration  6 weeks    PT Treatment/Interventions  ADLs/Self Care Home Management;Cryotherapy;Electrical Stimulation;Moist Heat;Iontophoresis 4mg /ml Dexamethasone;Traction;Therapeutic exercise;Therapeutic activities;Ultrasound;Patient/family education;Manual techniques;Taping;Dry needling;Passive range of motion    PT Next Visit Plan  continue with focus on deep tissue work Rt cervical area; evaluate home traction unit for proper fit and use    PT Home Exercise Plan  Access Code: [email or web address], verbally added W's.    Consulted and Agree with Plan of Care  Patient       Patient will benefit from skilled therapeutic intervention in order to improve the following deficits and impairments:  Increased fascial restricitons, Increased muscle spasms, Pain, Postural dysfunction, Impaired flexibility, Decreased range of motion  Visit Diagnosis: 1. Cervicalgia  2.  Abnormal posture   3. Other symptoms and signs involving the musculoskeletal system        Problem List Patient Active Problem List   Diagnosis Date Noted  . DDD (degenerative disc disease), cervical 03/18/2019  . Left lumbar radiculitis 10/23/2017  . History of arthroplasty of both knees 11/02/2015    Alyx Gee Rober MinionP Ardena Gangl PT, MPH  05/08/2019, 2:57 PM  [facility] [address] [place], [place], [address] Phone: [phone]   Fax:  [phone]  Name: Terry Greene MRN: [id number] Date of Birth: 1955-12-11

## 2019-05-12 ENCOUNTER — Other Ambulatory Visit: Payer: Self-pay

## 2019-05-12 ENCOUNTER — Encounter: Payer: Self-pay | Admitting: Rehabilitative and Restorative Service Providers"

## 2019-05-12 ENCOUNTER — Ambulatory Visit: Payer: PRIVATE HEALTH INSURANCE | Admitting: Rehabilitative and Restorative Service Providers"

## 2019-05-12 DIAGNOSIS — R293 Abnormal posture: Secondary | ICD-10-CM

## 2019-05-12 DIAGNOSIS — R29898 Other symptoms and signs involving the musculoskeletal system: Secondary | ICD-10-CM | POA: Diagnosis not present

## 2019-05-12 DIAGNOSIS — M542 Cervicalgia: Secondary | ICD-10-CM

## 2019-05-12 NOTE — Therapy (Signed)
[place], [place], [address] Phone: [phone]   Fax:  [phone]  Physical Therapy Treatment  Patient Details  Name: Terry Greene MRN: [id number] Date of Birth: November 10, 1955 Referring Provider (PT): Silverio Decamp, MD   Encounter Date: 05/12/2019  PT End of Session - 05/12/19 0823    Visit Number  14    Number of Visits  22    Date for PT Re-Evaluation  06/12/19    PT Start Time  0812    PT Stop Time  0845    PT Time Calculation (min)  33 min    Activity Tolerance  Patient tolerated treatment well       History reviewed. No pertinent past medical history.  History reviewed. No pertinent surgical history.  There were no vitals filed for this visit.  Subjective Assessment - 05/12/19 0824    Subjective  Much better. Has not used the home traction unit - more comfortable after review and modifications today in the clinic    Currently in Pain?  Yes    Pain Score  1     Pain Location  Neck    Pain Orientation  Right    Pain Descriptors / Indicators  Aching    Pain Type  Acute pain                       [facility] Adult PT Treatment/Exercise - 05/12/19 0001      Neck Exercises: Seated   Neck Retraction  5 reps;5 secs    Cervical Rotation  Right;Left;5 reps   w/ chin tuck   Lateral Flexion  Right;Left;5 reps   w/ chin tuck      Manual Therapy   Manual therapy comments  pt supine hooklying     Joint Mobilization  working through the 1st rib and clavicle Rt     Soft tissue mobilization  deep tissue work through the ant/lat/posterior cervical spine into the pecs and upper traps; deep tissue work into the scaleni Rt > Lt     Myofascial Release  anterior cervical to pecs     Passive ROM  axial extension head supported by PT 4-5 reps 10-15 sec hold; lateral cervical flexion     Manual Traction  cervical traction through the treatment 20-30 sec hold several reps      Neck  Exercises: Stretches   Other Neck Stretches  3 position doorway stretch x 30 sec x 2 reps each position              PT Education - 05/12/19 0843    Education Details  adjustment of cervical traction unit with trial in the clinic    Person(s) Educated  Patient    Methods  Explanation;Verbal cues;Tactile cues    Comprehension  Verbalized understanding;Returned demonstration;Verbal cues required;Tactile cues required          PT Long Term Goals - 05/01/19 1515      PT LONG TERM GOAL #1   Time  18    Period  Weeks    Status  New    Target Date  06/12/19      PT LONG TERM GOAL #2   Title  report pain < 4/10 with activity for improved function    Time  12    Period  Weeks    Status  Achieved      PT LONG TERM GOAL #3   Title  Rt  cervical rotation improved at least 10 degrees without increase in pain for improved function    Time  12    Period  Weeks    Status  Achieved      PT LONG TERM GOAL #4   Title  Improve FOTO to </= 24% limitation    Time  12    Period  Weeks    Status  Achieved            Plan - 05/12/19 [id number]    Clinical Impression Statement  Good improvement following last treatment. Continues to have some limitations with ROM and some discomfort but significant improvement from last week. Good response to manual work especially trial of axial extension. Pain free with good ROM/mobilty end of treatment.    Personal Factors and Comorbidities  Comorbidity 2;Past/Current Experience    Comorbidities  DDD, arthritis, prior hx of DN    Examination-Activity Limitations  Lift;Reach Overhead    Examination-Participation Restrictions  Other   work   Stability/Clinical Decision Making  Stable/Uncomplicated    Rehab Potential  Good    PT Frequency  2x / week    PT Duration  6 weeks    PT Treatment/Interventions  ADLs/Self Care Home Management;Cryotherapy;Electrical Stimulation;Moist Heat;Iontophoresis 4mg /ml Dexamethasone;Traction;Therapeutic  exercise;Therapeutic activities;Ultrasound;Patient/family education;Manual techniques;Taping;Dry needling;Passive range of motion    PT Next Visit Plan  continue with focus on deep tissue work Rt cervical area; evaluate home traction unit for proper fit and use    PT Home Exercise Plan  Access Code: [email or web address], verbally added W's.    Consulted and Agree with Plan of Care  Patient       Patient will benefit from skilled therapeutic intervention in order to improve the following deficits and impairments:  Increased fascial restricitons, Increased muscle spasms, Pain, Postural dysfunction, Impaired flexibility, Decreased range of motion  Visit Diagnosis: 1. Cervicalgia   2. Abnormal posture   3. Other symptoms and signs involving the musculoskeletal system        Problem List Patient Active Problem List   Diagnosis Date Noted  . DDD (degenerative disc disease), cervical 03/18/2019  . Left lumbar radiculitis 10/23/2017  . History of arthroplasty of both knees 11/02/2015    Tirrell Buchberger Rober MinionP Naquan Garman PT, MPH  05/12/2019, 8:47 AM  [facility] [address] [place], [place], [address] Phone: [phone]   Fax:  [phone]  Name: Terry Greene MRN: [id number] Date of Birth: [birth year]

## 2019-05-15 ENCOUNTER — Encounter: Payer: Self-pay | Admitting: Rehabilitative and Restorative Service Providers"

## 2019-05-15 ENCOUNTER — Other Ambulatory Visit: Payer: Self-pay

## 2019-05-15 ENCOUNTER — Ambulatory Visit: Payer: PRIVATE HEALTH INSURANCE | Admitting: Rehabilitative and Restorative Service Providers"

## 2019-05-15 DIAGNOSIS — R293 Abnormal posture: Secondary | ICD-10-CM

## 2019-05-15 DIAGNOSIS — M542 Cervicalgia: Secondary | ICD-10-CM | POA: Diagnosis not present

## 2019-05-15 DIAGNOSIS — R29898 Other symptoms and signs involving the musculoskeletal system: Secondary | ICD-10-CM | POA: Diagnosis not present

## 2019-05-15 NOTE — Therapy (Addendum)
[place], [place], [address] Phone: [phone]   Fax:  [phone]  Physical Therapy Treatment  Patient Details  Name: Terry Greene MRN: [id number] Date of Birth: 02/22/1956 Referring Provider (PT): Silverio Decamp, MD   Encounter Date: 05/15/2019  PT End of Session - 05/15/19 1534    Visit Number  15    Number of Visits  22    Date for PT Re-Evaluation  06/12/19    PT Start Time  4536    PT Stop Time  4680    PT Time Calculation (min)  42 min    Activity Tolerance  Patient tolerated treatment well       History reviewed. No pertinent past medical history.  History reviewed. No pertinent surgical history.  There were no vitals filed for this visit.  Subjective Assessment - 05/15/19 1535    Subjective  Head and neck are feeling better - no pain and moving better. Has a little sore spot but otherwise is good. Using the home cervical traction and it feels good - has used it twice since he was in for his last PT.    Currently in Pain?  No/denies         [facility] PT Assessment - 05/15/19 0001      Assessment   Medical Diagnosis  M50.30 (ICD-10-CM) - DDD (degenerative disc disease), cervical    Referring Provider (PT)  Silverio Decamp, MD    Onset Date/Surgical Date  03/09/19    Hand Dominance  Right    Next MD Visit  PRN      Observation/Other Assessments   Focus on Therapeutic Outcomes (FOTO)   26% limitation       AROM   Cervical Flexion  47    Cervical Extension  42    Cervical - Right Side Bend  39    Cervical - Left Side Bend  28    Cervical - Right Rotation  64    Cervical - Left Rotation  65      Palpation   Palpation comment  minimal muscular tightness ant/lat/posterior cervical musculature; upper trap/levator scapula Rt as well as into Rt first rib area                    [facility] Adult PT Treatment/Exercise - 05/15/19 0001      Neck Exercises:  Seated   Neck Retraction  5 reps;5 secs    Cervical Rotation  Right;Left;5 reps   w/ chin tuck   Lateral Flexion  Right;Left;5 reps   w/ chin tuck      Neck Exercises: Prone   Axial Exension  5 reps      Manual Therapy   Manual therapy comments  pt prone and supine hooklying     Joint Mobilization  working through the 1st rib and clavicle Rt     Soft tissue mobilization  deep tissue work through the ant/lat/posterior cervical spine into the pecs and upper traps; deep tissue work into the scaleni Rt > Lt     Myofascial Release  anterior cervical to pecs     Passive ROM  axial extension head supported by PT 4-5 reps 10-15 sec hold; lateral cervical flexion     Manual Traction  cervical traction through the treatment 20-30 sec hold several reps      Neck Exercises: Stretches   Other Neck Stretches  3 position doorway stretch x 30 sec x 2  reps each position        Trigger Point Dry Needling - 05/15/19 0001    Consent Given?  Yes    Education Handout Provided  Previously provided    Other Dry Needling  Rt     SubOccipitals Response  Palpable increased muscle length    Scalenes Response  Palpable increased muscle length    Splenius capitus Response  Palpable increased muscle length    Cervical multifidi Response  Palpable increased muscle length                PT Long Term Goals - 05/01/19 1515      PT LONG TERM GOAL #1   Time  18    Period  Weeks    Status  New    Target Date  06/12/19      PT LONG TERM GOAL #2   Title  report pain < 4/10 with activity for improved function    Time  12    Period  Weeks    Status  Achieved      PT LONG TERM GOAL #3   Title  Rt cervical rotation improved at least 10 degrees without increase in pain for improved function    Time  12    Period  Weeks    Status  Achieved      PT LONG TERM GOAL #4   Title  Improve FOTO to </= 24% limitation    Time  12    Period  Weeks    Status  Achieved            Plan - 05/15/19  1535    Clinical Impression Statement  Excellent progress overall. Patient reports that he is pain free. Cervical ROM is WFL's and pain free. He has returned to all normal functional activities.Terry Greene will continue with independent HEP and callwith any questions or problems.    Personal Factors and Comorbidities  Comorbidity 2;Past/Current Experience    Comorbidities  DDD, arthritis, prior hx of DN    Examination-Activity Limitations  Lift;Reach Overhead    Examination-Participation Restrictions  Other   work   Stability/Clinical Decision Making  Stable/Uncomplicated    Rehab Potential  Good    PT Frequency  2x / week    PT Duration  6 weeks    PT Treatment/Interventions  ADLs/Self Care Home Management;Cryotherapy;Electrical Stimulation;Moist Heat;Iontophoresis 21m/ml Dexamethasone;Traction;Therapeutic exercise;Therapeutic activities;Ultrasound;Patient/family education;Manual techniques;Taping;Dry needling;Passive range of motion    PT Next Visit Plan  Continue with independent HEP -pt will call with any questions or problems.    PT Home Exercise Plan  Access Code: [email or web address] verbally added W's.    Consulted and Agree with Plan of Care  Patient       Patient will benefit from skilled therapeutic intervention in order to improve the following deficits and impairments:  Increased fascial restricitons, Increased muscle spasms, Pain, Postural dysfunction, Impaired flexibility, Decreased range of motion  Visit Diagnosis: 1. Cervicalgia   2. Abnormal posture   3. Other symptoms and signs involving the musculoskeletal system        Problem List Patient Active Problem List   Diagnosis Date Noted  . DDD (degenerative disc disease), cervical 03/18/2019  . Left lumbar radiculitis 10/23/2017  . History of arthroplasty of both knees 11/02/2015    Terry Greene Terry SimmerPT, MPH  05/15/2019, 4:16 PM  [facility] [place]  [address]Phone: [phone]  Fax:  [phone]  Name: Terry Greene MRN: [id number] Date of Birth: 27-Dec-1955  PHYSICAL THERAPY DISCHARGE SUMMARY  Visits from Start of Care: 15  Current functional level related to goals / functional outcomes: See last progress note for discharge status    Remaining deficits: Unknown - no symptoms at last visit   Education / Equipment: HEP  Plan: Patient agrees to discharge.  Patient goals were not met. Patient is being discharged due to meeting the stated rehab goals.  ?????    Rayen Dafoe P. Helene Kelp PT, MPH 06/06/19 10:56 AM

## 2019-05-19 ENCOUNTER — Encounter: Payer: PRIVATE HEALTH INSURANCE | Admitting: Rehabilitative and Restorative Service Providers"

## 2022-11-24 ENCOUNTER — Other Ambulatory Visit: Payer: Self-pay | Admitting: Sports Medicine

## 2022-11-24 ENCOUNTER — Ambulatory Visit: Payer: Medicare HMO | Admitting: Sports Medicine

## 2022-11-24 ENCOUNTER — Ambulatory Visit (INDEPENDENT_AMBULATORY_CARE_PROVIDER_SITE_OTHER): Payer: Medicare HMO

## 2022-11-24 DIAGNOSIS — M1812 Unilateral primary osteoarthritis of first carpometacarpal joint, left hand: Secondary | ICD-10-CM | POA: Diagnosis not present

## 2022-11-24 MED ORDER — MELOXICAM 15 MG PO TABS: ORAL_TABLET | ORAL | 3 refills | Status: DC

## 2022-11-24 MED ORDER — MELOXICAM 15 MG PO TABS: ORAL_TABLET | ORAL | 3 refills | Status: AC

## 2022-11-24 NOTE — Assessment & Plan Note (Signed)
Pleasant 67 year old male, has had some pain left thenar eminence. Worse when gripping objects. On exam he has tenderness with stressing of the first [facility]. We will start conservatively, home conditioning, meloxicam, x-rays. Return to see me in 2 to 4 weeks and we will consider bracing and injection if not better.

## 2022-11-24 NOTE — Progress Notes (Signed)
    Procedures performed today:    None.  Independent interpretation of notes and tests performed by another provider:   None.  Brief History, Exam, Impression, and Recommendations:    Primary osteoarthritis of first carpometacarpal joint of left hand Pleasant 67 year old male, has had some pain left thenar eminence. Worse when gripping objects. On exam he has tenderness with stressing of the first [facility]. We will start conservatively, home conditioning, meloxicam, x-rays. Return to see me in 2 to 4 weeks and we will consider bracing and injection if not better.  Chronic process with exacerbation and pharmacologic intervention  ____________________________________________ Gwen Her. [name], M.D., ABFM., CAQSM., AME. Primary Care and Sports Medicine [facility]  Adjunct Professor of [employer] [facility] [employer]  Risk manager

## 2022-12-21 ENCOUNTER — Ambulatory Visit: Payer: Self-pay | Admitting: Sports Medicine

## 2022-12-29 ENCOUNTER — Ambulatory Visit (INDEPENDENT_AMBULATORY_CARE_PROVIDER_SITE_OTHER): Payer: Medicare HMO | Admitting: Sports Medicine

## 2022-12-29 DIAGNOSIS — M1812 Unilateral primary osteoarthritis of first carpometacarpal joint, left hand: Secondary | ICD-10-CM

## 2022-12-29 MED ORDER — DICLOFENAC SODIUM 1 % EX GEL: 2.0000 g | Freq: Four times a day (QID) | CUTANEOUS | 11 refills | Status: AC

## 2022-12-29 NOTE — Progress Notes (Signed)
    Procedures performed today:    None.  Independent interpretation of notes and tests performed by another provider:   None.  Brief History, Exam, Impression, and Recommendations:    Primary osteoarthritis of first carpometacarpal joint of left hand Terry Greene returns, he continues to have pain left first [facility], we did meloxicam, home physical therapy and he had some improvement. He is not interested in an injection today so we will do a short thumb spica EXOS brace, topical Voltaren, continue home physical therapy, return to see me and if not better in about a month we can consider injection.  Chronic process with exacerbation and pharmacologic intervention  ____________________________________________ Gwen Her. [name], M.D., ABFM., CAQSM., AME. Primary Care and Sports Medicine [facility]  Adjunct Professor of [employer] [facility] [employer] Medicine  Risk manager

## 2022-12-29 NOTE — Assessment & Plan Note (Signed)
Terry Greene returns, he continues to have pain left first [facility], we did meloxicam, home physical therapy and he had some improvement. He is not interested in an injection today so we will do a short thumb spica EXOS brace, topical Voltaren, continue home physical therapy, return to see me and if not better in about a month we can consider injection.

## 2023-01-26 ENCOUNTER — Ambulatory Visit: Payer: Medicare HMO | Admitting: Sports Medicine

## 2024-02-07 ENCOUNTER — Telehealth (HOSPITAL_COMMUNITY): Payer: Self-pay

## 2024-02-07 ENCOUNTER — Encounter (HOSPITAL_COMMUNITY): Payer: Self-pay

## 2024-02-07 NOTE — Telephone Encounter (Signed)
 Outside/paper referral received from [facility]. Called and left a message for pt to call back.  Sent letter.

## 2024-02-27 ENCOUNTER — Telehealth (HOSPITAL_COMMUNITY): Payer: Self-pay

## 2024-02-27 NOTE — Telephone Encounter (Signed)
 Called patient in regards to cardiac rehab, patient states they are not interested in cardiac rehab at this time due to the amount of driving he would need to do. Closed referral.

## 2024-06-10 ENCOUNTER — Encounter: Payer: Self-pay | Admitting: Sports Medicine
